# Patient Record
Sex: Male | Born: 1960
Health system: Southern US, Community
[De-identification: ages and names within clinical notes are randomized; demographics above are authoritative.]

## PROBLEM LIST (undated history)

## (undated) DIAGNOSIS — I517 Cardiomegaly: Secondary | ICD-10-CM

## (undated) DIAGNOSIS — G43909 Migraine, unspecified, not intractable, without status migrainosus: Secondary | ICD-10-CM

## (undated) DIAGNOSIS — I10 Essential (primary) hypertension: Secondary | ICD-10-CM

## (undated) DIAGNOSIS — C649 Malignant neoplasm of unspecified kidney, except renal pelvis: Secondary | ICD-10-CM

## (undated) DIAGNOSIS — C801 Malignant (primary) neoplasm, unspecified: Secondary | ICD-10-CM

## (undated) HISTORY — DX: Migraine, unspecified, not intractable, without status migrainosus: G43.909

---

## 2007-08-14 DIAGNOSIS — I10 Essential (primary) hypertension: Secondary | ICD-10-CM

## 2007-08-14 HISTORY — DX: Essential (primary) hypertension: I10

## 2009-08-13 HISTORY — PX: LAPAROSCOPIC NEPHRECTOMY: SUR781

## 2009-11-25 ENCOUNTER — Ambulatory Visit (HOSPITAL_COMMUNITY): Admission: RE | Admit: 2009-11-25 | Discharge: 2009-11-25 | Payer: Self-pay | Admitting: Family Medicine

## 2010-05-23 ENCOUNTER — Ambulatory Visit: Payer: Self-pay | Admitting: Internal Medicine

## 2010-05-25 DIAGNOSIS — R1032 Left lower quadrant pain: Secondary | ICD-10-CM | POA: Insufficient documentation

## 2010-06-05 ENCOUNTER — Ambulatory Visit (HOSPITAL_COMMUNITY): Admission: RE | Admit: 2010-06-05 | Discharge: 2010-06-05 | Payer: Self-pay | Admitting: Internal Medicine

## 2010-06-05 ENCOUNTER — Ambulatory Visit: Payer: Self-pay | Admitting: Internal Medicine

## 2010-06-06 ENCOUNTER — Ambulatory Visit (HOSPITAL_COMMUNITY): Admission: RE | Admit: 2010-06-06 | Discharge: 2010-06-06 | Payer: Self-pay | Admitting: Internal Medicine

## 2010-06-12 ENCOUNTER — Encounter: Payer: Self-pay | Admitting: Internal Medicine

## 2010-06-12 ENCOUNTER — Ambulatory Visit (HOSPITAL_COMMUNITY): Admission: RE | Admit: 2010-06-12 | Discharge: 2010-06-12 | Payer: Self-pay | Admitting: Internal Medicine

## 2010-06-13 ENCOUNTER — Telehealth (INDEPENDENT_AMBULATORY_CARE_PROVIDER_SITE_OTHER): Payer: Self-pay

## 2010-09-12 NOTE — Assessment & Plan Note (Signed)
Summary: ABD PAIN,CONSULT FOR TCS/SS   Visit Type:  Initial Consult Referring Provider:  Phillips Odor Primary Care Provider:  McGough  Chief Complaint:  abd pain.  History of Present Illness: 50 year old African American male referred here for further evaluate of left lower quadrant abdominal pain and  consideration of a colonoscopy. Patient states he's had vague left lower quadrant abdominal pain off and on for the past year;  may last for a day, may last for a week with weeks in between with no symptaoms.  No blood per rectum, no constipation or diarrhea. No upper GI tract symptoms such as reflux, nausea, vomiting, fever chills denies weight loss. Sometimes he mild discomfort with urinating. He was treated for prostatitis with Cipro and actually felt his symptoms got somewhat better around the time of antibiotic therapy. He has not had any imaging. There is no family history of colon polyps or colon cancer;  no prior colon image previously.     Current Medications (verified): 1)  Lisinopril .... One Daily 2)  Norvasc .... One Tablet Daily  Allergies (verified): No Known Drug Allergies  Past History:  Family History: Last updated: 05/23/2010 Father: Living age  62  Heart Murmurs Mother: Living age 55  healthy Siblings: One brother and one sister    healthy  Social History: Last updated: 05/23/2010 Marital Status: Widower Children: 3 Occupation: Location manager  Past Medical History: Hypertension  Past Surgical History: None  Family History: Father: Living age  61  Heart Murmurs Mother: Living age 69  healthy Siblings: One brother and one sister    healthy  Social History: Marital Status: Widower Children: 3 Occupation: Location manager  Review of Systems       no change in weight, fever, chills ,chest pain or dyspnea on exertion; otherwise as in history of present illness.  Vital Signs:  Patient profile:   50 year old male Height:      68 inches Weight:       192 pounds BMI:     29.30 Temp:     98.5 degrees F oral BP sitting:   138 / 82  (left arm) Cuff size:   regular  Vitals Entered By: Cloria Spring LPN (May 23, 2010 9:09 AM)  Physical Exam  General:  alert conversant in no acute distress Eyes:  no scleral icterus. Lungs:  clear to auscultation Heart:  regular rate rhythm without murmur gallop or Abdomen:  flat positive bowel sounds soft and entirely nontender to deep palpation. No appreciable mass or hepatosplenomegaly Rectal:  deferred to time of colonoscopy.  Impression & Recommendations: Impression: Very pleasant 50 year old gentleman with intermittent self-limiting bouts of left lower quadrant abdominal pain. Symptoms are somewhat vague and has not affected his regular routine. No associated bowel symptoms. There's a questioable association with urinary tract symptoms and some transient improvemen, temporally, with  antibiotic therapy.  Although he could be having a low-grade recurrent diverticulitis the presentation would be very atypical. Abdominal wall etiology or even occult urinary tract pathology such as kidney stones would remain in the differential at this time.  I do agree with Dr. Phillips Odor that he needs a screening colonoscopy.  Recommendations: Screening/diagnostic colonoscopy in the near future risks, benefits, limitations imponderables and alternatives have been reviewed;  questions have been answered. He's agreeable.  He may end up getting a CT following colonoscopy depending on what we find. Further recommendations to follow.  'd like to thank Dr. Dorthey Sawyer for his kind referral of this nice gentleman.  Appended Document: Orders Update    Clinical Lists Changes  Problems: Added new problem of ABDOMINAL PAIN, LEFT LOWER QUADRANT (ICD-789.04) Orders: Added new Service order of Consultation Level III [id number]) - Signed

## 2010-09-12 NOTE — Progress Notes (Signed)
Summary: phone note/ MRI results  Phone Note Other Incoming   Caller: Jane from [facility] Summary of Call: Erskine Squibb from [facility] called STAT report on MRI. Pt has a 1.1 CM medial upper pole left renal lesion, suspicious for cystic renal cell carcinomer. Urology consult is recommended. Initial call taken by: Cloria Spring LPN,  June 13, 2010 10:42 AM     Appended Document: phone note/ MRI results See Dr. Luvenia Starch comment under the MRI report.

## 2010-09-12 NOTE — Letter (Signed)
Summary: TCS ORDER  TCS ORDER   Imported By: Ave Filter 05/23/2010 10:20:45  _____________________________________________________________________  External Attachment:    Type:   Image     Comment:   External Document

## 2010-09-12 NOTE — Letter (Signed)
Summary: MRI ABD ORDER  MRI ABD ORDER   Imported By: Ave Filter 06/12/2010 16:09:09  _____________________________________________________________________  External Attachment:    Type:   Image     Comment:   External Document  Appended Document: MRI ABD ORDER VQQ#[id number]

## 2010-09-12 NOTE — Miscellaneous (Signed)
Summary: Orders Update  Clinical Lists Changes  Problems: Added new problem of SCREENING FOR UNSPECIFIED CONDITION (ICD-V82.9) Orders: Added new Test order of T-Creatinine Blood ([id number]) - Signed 

## 2010-10-25 LAB — CREATININE, SERUM
Creatinine, Ser: 1.1 mg/dL (ref 0.4–1.5)
GFR calc non Af Amer: >60 mL/min (ref >60)

## 2012-02-20 ENCOUNTER — Emergency Department (HOSPITAL_COMMUNITY): Payer: Managed Care, Other (non HMO)

## 2012-02-20 ENCOUNTER — Observation Stay (HOSPITAL_COMMUNITY)
Admission: EM | Admit: 2012-02-20 | Discharge: 2012-02-21 | Disposition: A | Discharge status: Home / Self Care | Payer: Managed Care, Other (non HMO) | Source: Ambulatory Visit | Attending: Emergency Medicine | Admitting: Emergency Medicine

## 2012-02-20 ENCOUNTER — Encounter (HOSPITAL_COMMUNITY): Payer: Self-pay

## 2012-02-20 DIAGNOSIS — R0989 Other specified symptoms and signs involving the circulatory and respiratory systems: Secondary | ICD-10-CM | POA: Insufficient documentation

## 2012-02-20 DIAGNOSIS — R079 Chest pain, unspecified: Principal | ICD-10-CM | POA: Insufficient documentation

## 2012-02-20 DIAGNOSIS — I517 Cardiomegaly: Secondary | ICD-10-CM | POA: Insufficient documentation

## 2012-02-20 DIAGNOSIS — C801 Malignant (primary) neoplasm, unspecified: Secondary | ICD-10-CM | POA: Insufficient documentation

## 2012-02-20 DIAGNOSIS — C649 Malignant neoplasm of unspecified kidney, except renal pelvis: Secondary | ICD-10-CM | POA: Insufficient documentation

## 2012-02-20 DIAGNOSIS — R42 Dizziness and giddiness: Secondary | ICD-10-CM | POA: Insufficient documentation

## 2012-02-20 DIAGNOSIS — R0609 Other forms of dyspnea: Secondary | ICD-10-CM | POA: Insufficient documentation

## 2012-02-20 DIAGNOSIS — I10 Essential (primary) hypertension: Secondary | ICD-10-CM | POA: Insufficient documentation

## 2012-02-20 HISTORY — DX: Malignant neoplasm of unspecified kidney, except renal pelvis: C64.9

## 2012-02-20 HISTORY — DX: Malignant (primary) neoplasm, unspecified: C80.1

## 2012-02-20 HISTORY — DX: Essential (primary) hypertension: I10

## 2012-02-20 HISTORY — DX: Cardiomegaly: I51.7

## 2012-02-20 LAB — COMPREHENSIVE METABOLIC PANEL
AST: 26 U/L (ref 0–37)
Albumin: 3.8 g/dL (ref 3.5–5.2)
BUN: 13 mg/dL (ref 6–23)
Calcium: 9.4 mg/dL (ref 8.4–10.5)
Chloride: 104 mEq/L (ref 96–112)
Creatinine, Ser: 1.07 mg/dL (ref 0.50–1.35)
Total Bilirubin: 0.3 mg/dL (ref 0.3–1.2)
Total Protein: 7.9 g/dL (ref 6.0–8.3)

## 2012-02-20 LAB — POCT I-STAT TROPONIN I
Troponin i, poc: 0 ng/mL (ref 0.00–0.08)
Troponin i, poc: 0 ng/mL (ref 0.00–0.08)

## 2012-02-20 LAB — CBC
HCT: 39.2 % (ref 39.0–52.0)
RDW: 14.5 % (ref 11.5–15.5)
WBC: 5.2 10*3/uL (ref 4.0–10.5)

## 2012-02-20 LAB — CK TOTAL AND CKMB (NOT AT ARMC)
CK, MB: 6.1 ng/mL (ref 0.3–4.0)
Relative Index: 0.9 (ref 0.0–2.5)
Total CK: 701 U/L — ABNORMAL HIGH (ref 7–232)

## 2012-02-20 LAB — TROPONIN I: Troponin I: <0.3 ng/mL (ref <0.30)

## 2012-02-20 LAB — PROTIME-INR
INR: 1.06 (ref 0.00–1.49)
Prothrombin Time: 14 seconds (ref 11.6–15.2)

## 2012-02-20 LAB — APTT: aPTT: 29 seconds (ref 24–37)

## 2012-02-20 MED ORDER — SODIUM CHLORIDE 0.9 % IV SOLN
1000.0000 mL | INTRAVENOUS | Status: DC
  Administered 2012-02-20: 1000 mL via INTRAVENOUS

## 2012-02-20 MED ORDER — ASPIRIN 81 MG PO CHEW
162.0000 mg | CHEWABLE_TABLET | Freq: Once | ORAL | Status: AC
  Administered 2012-02-20: 162 mg via ORAL
  Filled 2012-02-20: qty 2

## 2012-02-20 NOTE — ED Provider Notes (Signed)
Patient moved to CDU under chest pain protocol. Patient resting comfortably at present without return of chest pain since 3 pm. Lungs CTA bilaterally. S1/S2, RRR, no murmur. Abdomen soft, bowel sounds present. Strong distal pulses palpated all extremities. Sinus rhythm on monitor without ectopy. Troponin negative x 2, 12 lead reviewed, no indication of ischemia. Patient scheduled for stress test in AM. Diagnostic and treatment plan discussed with patient.  Patient care to be resumed by Dr. Anitra Lauth. Patient history has been discussed with physician resuming care. Patient is in the CDU for observation and has stress tests pending. It has been discussed w oncoming provider that even though pt has a BMI with in limits for CT chest it was though by previous provider that d/t the fact that the pt has a history of nephrectomy they should not have the contrast dye. Disposition will be determined once results obtained. Patient is currently asymptomatic, in NAD, VSS, and has no current complaints.     Jaci Carrel, [place] 02/22/12 [phone]

## 2012-02-20 NOTE — ED Notes (Signed)
Pt complains of chest pain ,diaphoresis, nausea and headache on set this am, and right arm pian.

## 2012-02-20 NOTE — ED Notes (Signed)
BMI = 31.1

## 2012-02-20 NOTE — ED Provider Notes (Addendum)
History     CSN: [id number]  Arrival date & time 02/20/12  1235   First MD Initiated Contact with Patient 02/20/12 1742      Chief Complaint  Patient presents with  . Chest Pain    (Consider location/radiation/quality/duration/timing/severity/associated sxs/prior treatment) Patient is a 51 y.o. male presenting with chest pain. The history is provided by the patient and medical records.  Chest Pain Duration of episode(s) is 5 hours. Chest pain occurs intermittently. The chest pain is resolved. The pain is associated with breathing and exertion. At its most intense, the pain is at 6/10. The pain is currently at 0/10. The quality of the pain is described as pleuritic, heavy and tightness. The pain radiates to the right shoulder. Chest pain is worsened by deep breathing and exertion. Primary symptoms include dizziness. Pertinent negatives for primary symptoms include no fatigue, no syncope, no shortness of breath, no wheezing, no abdominal pain, no nausea and no vomiting.  Dizziness also occurs with diaphoresis. Dizziness does not occur with nausea, vomiting or weakness.   Associated symptoms include diaphoresis.  Pertinent negatives for associated symptoms include no near-syncope, no orthopnea, no paroxysmal nocturnal dyspnea and no weakness. He tried nothing for the symptoms. Risk factors include sedentary lifestyle.  His past medical history is significant for hypertension.  Pertinent negatives for past medical history include no CAD, no congenital heart disease, no COPD, no CHF, no diabetes, no DVT and no PE.  Pertinent negatives for family medical history include: no CAD in family and no early MI in family.  Procedure history is negative for cardiac catheterization, echocardiogram and stress echo.     Past Medical History  Diagnosis Date  . Hypertension   . Enlarged heart   . Cancer   . Kidney cancer, primary, with metastasis from kidney to other site     History reviewed. No  pertinent past surgical history.  History reviewed. No pertinent family history.  History  Substance Use Topics  . Smoking status: Former Games developer  . Smokeless tobacco: Not on file  . Alcohol Use: Yes      Review of Systems  Constitutional: Positive for diaphoresis. Negative for fatigue.  HENT: Negative for neck pain.   Respiratory: Positive for chest tightness. Negative for apnea, shortness of breath and wheezing.   Cardiovascular: Positive for chest pain. Negative for orthopnea, syncope and near-syncope.  Gastrointestinal: Negative for nausea, vomiting, abdominal pain and abdominal distention.  Genitourinary: Negative for dysuria and difficulty urinating.  Skin: Negative for color change.  Neurological: Positive for dizziness and light-headedness. Negative for syncope and weakness.  Hematological: Does not bruise/bleed easily.  Psychiatric/Behavioral: Negative for agitation.    Allergies  Review of patient's allergies indicates no known allergies.  Home Medications   Current Outpatient Rx  Name Route Sig Dispense Refill  . AMLODIPINE BESYLATE 10 MG PO TABS Oral Take 10 mg by mouth every morning.    Marland Kitchen OMEGA-3 FATTY ACIDS 1000 MG PO CAPS Oral Take 1 g by mouth daily.    Marland Kitchen LISINOPRIL 10 MG PO TABS Oral Take 10 mg by mouth every morning.    . ADULT MULTIVITAMIN W/MINERALS CH Oral Take 1 tablet by mouth daily.    Marland Kitchen VITAMIN C 500 MG PO TABS Oral Take 500 mg by mouth daily.      BP 124/79  Pulse 91  Temp 98 F (36.7 C) (Oral)  Resp 22  SpO2 100%  Physical Exam  Constitutional: He is oriented to person, place, and time.  He appears well-developed.  HENT:  Head: Normocephalic.  Eyes: Conjunctivae and EOM are normal. Pupils are equal, round, and reactive to light.  Neck: Normal range of motion. Neck supple.  Cardiovascular: Normal rate, regular rhythm and normal heart sounds.   No murmur heard. Pulmonary/Chest: Effort normal and breath sounds normal. No respiratory  distress. He has no wheezes.  Abdominal: Soft. Bowel sounds are normal. He exhibits no distension. There is no tenderness.  Musculoskeletal: Normal range of motion.  Neurological: He is alert and oriented to person, place, and time.  Skin: Skin is warm and dry.    ED Course  Procedures (including critical care time)  Labs Reviewed  CBC - Abnormal; Notable for the following:    MCV 77.5 (*)     All other components within normal limits  CK TOTAL AND CKMB - Abnormal; Notable for the following:    Total CK 701 (*)     CK, MB 6.1 (*)     All other components within normal limits  COMPREHENSIVE METABOLIC PANEL - Abnormal; Notable for the following:    GFR calc non Af Amer 80 (*)     All other components within normal limits  POCT I-STAT TROPONIN I  PROTIME-INR  APTT  TROPONIN I  D-DIMER, QUANTITATIVE   Dg Chest 2 View  02/20/2012  *RADIOLOGY REPORT*  Clinical Data: Chest pain.  Hypertension.  CHEST - 2 VIEW  Comparison: 11/25/2009  Findings: Heart size is normal.  Mediastinal shadows are normal. Lungs are clear.  There is chronic relative elevation of the left hemidiaphragm.  Vascularity is normal.  No effusions.  No significant bony findings.  IMPRESSION: No active disease  Original Report Authenticated By: Thomasenia Sales, M.D.     No diagnosis found.    MDM   DDX: ACS syndrome, PE, costochondritis, pneumonia, GERD  A/P: 51 y/o male with hx of HTN, renal cancer s/p nephrectomy from 3 years ago, and previous smoker comes in with cc of chest pain. The chest pain has some typical and atypical features. heaviness, tightness, pressure like, exertional, pleuritic, right sided).  Concerns are for ACS syndrome and PE. Pt's Wells score for PE is 0 (1 if we consider previous malignancy). His vitals are stable and WNL, no hypoxia. We will get d-dimer.  Pt's TIMI score is 0, but as the chest pain is typical, if Pe workup is negative, patient will be appropriate for obs  admission.  8:54 PM Pt's d-dimer negative, and troponin neg. EKG with no acute findings. Pt would prefer outpatient workup, and i called patient's PCP Dr. Renette Butters, at the office number and at [phone] (cell) and have been unsuccessful at reaching him directly. Plan is to admit to obs at this time as i am unable to reach the pcp. Pt aware.  8:56 PM  Date: 02/20/2012  Rate: 87  Rhythm: normal sinus rhythm  QRS Axis: normal  Intervals: normal  ST/T Wave abnormalities: normal  Conduction Disutrbances:none  Narrative Interpretation:   Old EKG Reviewed: none available    Derwood Kaplan, MD 02/20/12 2129  11:34 AM Pt s/p exercise stress, that is reported negative. Family is eager to go home, and not wanting to stay for a formal discharge. Will discuss the results with family and discharge.  Derwood Kaplan, MD 02/21/12 1134

## 2012-02-20 NOTE — ED Notes (Signed)
Critical ckmb 6.1 reported.

## 2012-02-20 NOTE — ED Notes (Signed)
Pt states pain was radiating down right arm but has resolved and HA has resolved.

## 2012-02-21 DIAGNOSIS — R072 Precordial pain: Secondary | ICD-10-CM

## 2012-02-21 NOTE — ED Notes (Signed)
Patient asking when he will be receiving his results. Called and spoke with rich. . He states he has just sent pt images to dr Tenny Craw. He will call dr Tenny Craw and ask for reading to be expidited. Patients male visitor gives impression she is angry. We have offered her food and drink but she is short and sharp with staff. Stating she just wants to leave. Pt states he is leaving in the next 30 minutes. Sinus rhythm on the montor. Pt denies pain . States he knows that there is nothing wrong with him.

## 2012-02-21 NOTE — Progress Notes (Signed)
  Echocardiogram Echocardiogram Stress Test has been performed.  [place]ian Co 02/21/2012, 8:55 AM

## 2012-02-21 NOTE — ED Notes (Signed)
Called rich again due to no results. Luan Pulling is going to call dr again.

## 2012-02-21 NOTE — ED Notes (Signed)
Dr Tenny Craw has called stress results. Pt stress test wnl. Dr Rhunette Croft aware and will discharge pt. Pt given meal

## 2012-02-21 NOTE — ED Notes (Signed)
Family at bedside. 

## 2012-02-25 NOTE — ED Provider Notes (Signed)
Medical screening examination/treatment/procedure(s) were performed by non-physician practitioner and as supervising physician I was immediately available for consultation/collaboration.   Dione Booze, MD 02/25/12 [phone]

## 2012-11-05 ENCOUNTER — Ambulatory Visit (HOSPITAL_COMMUNITY)
Admission: RE | Admit: 2012-11-05 | Discharge: 2012-11-05 | Disposition: A | Discharge status: Home / Self Care | Payer: Managed Care, Other (non HMO) | Source: Ambulatory Visit | Attending: Physician Assistant | Admitting: Physician Assistant

## 2012-11-05 ENCOUNTER — Other Ambulatory Visit (HOSPITAL_COMMUNITY): Payer: Self-pay | Admitting: Physician Assistant

## 2012-11-05 ENCOUNTER — Other Ambulatory Visit (HOSPITAL_COMMUNITY): Payer: Managed Care, Other (non HMO)

## 2012-11-05 ENCOUNTER — Encounter (HOSPITAL_COMMUNITY): Payer: Self-pay

## 2012-11-05 DIAGNOSIS — R1032 Left lower quadrant pain: Secondary | ICD-10-CM

## 2012-11-05 DIAGNOSIS — I10 Essential (primary) hypertension: Secondary | ICD-10-CM | POA: Insufficient documentation

## 2012-11-05 DIAGNOSIS — Z85528 Personal history of other malignant neoplasm of kidney: Secondary | ICD-10-CM | POA: Insufficient documentation

## 2012-11-05 DIAGNOSIS — Q619 Cystic kidney disease, unspecified: Secondary | ICD-10-CM | POA: Insufficient documentation

## 2012-11-05 MED ORDER — IOHEXOL 300 MG/ML  SOLN
100.0000 mL | Freq: Once | INTRAMUSCULAR | Status: AC | PRN
  Administered 2012-11-05: 100 mL via INTRAVENOUS

## 2013-01-15 ENCOUNTER — Ambulatory Visit (INDEPENDENT_AMBULATORY_CARE_PROVIDER_SITE_OTHER): Payer: Self-pay | Admitting: Surgery

## 2013-04-07 ENCOUNTER — Telehealth (INDEPENDENT_AMBULATORY_CARE_PROVIDER_SITE_OTHER): Payer: Self-pay

## 2013-04-07 NOTE — Telephone Encounter (Signed)
LMOM asking pt if he would like to come in tomorrow rather than Friday.

## 2013-04-10 ENCOUNTER — Ambulatory Visit (INDEPENDENT_AMBULATORY_CARE_PROVIDER_SITE_OTHER): Payer: Managed Care, Other (non HMO) | Admitting: Surgery

## 2013-05-22 IMAGING — CT CT ABD-PELV W/ CM
2 of 5 series · 15 of 46 positions shown, 17 images · IV contrast (Omnipaque 300)
Comparison: CT abdomen pelvis - 06/06/2010; CT abdomen pelvis -
06/12/2010

CLINICAL DATA: Left lower quadrant abdominal pain, history of left-
sided renal carcinoma, post partial nephrectomy

CT ABDOMEN AND PELVIS WITH CONTRAST
TECHNIQUE: Multidetector CT imaging of the abdomen and pelvis was
performed following the standard protocol during bolus
administration of intravenous contrast.
Contrast: 100mL OMNIPAQUE IOHEXOL 300 MG/ML  SOLN

[Series 2: abd_pel_with 5.0 b40f · axial · 0.69mm/px · z∈[-427,-62]mm · 12 of 83 slices shown, 14 images]
[im 5/83  soft-tissue]
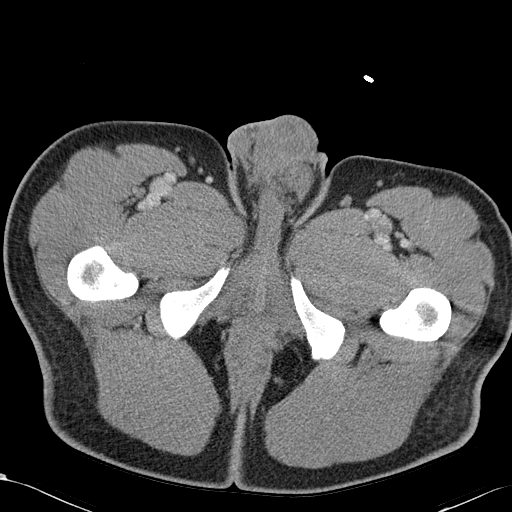
[im 5/83  bone]
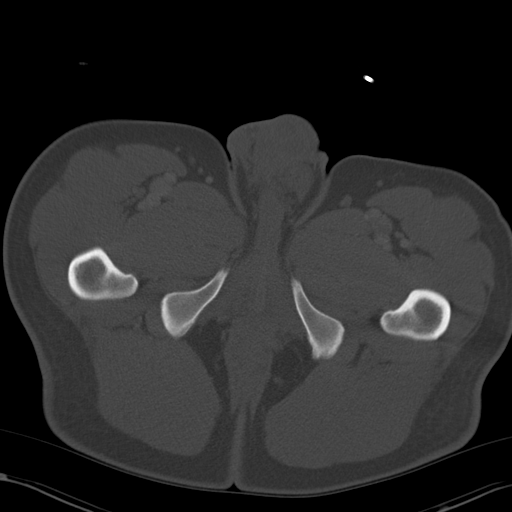
[im 13/83  soft-tissue]
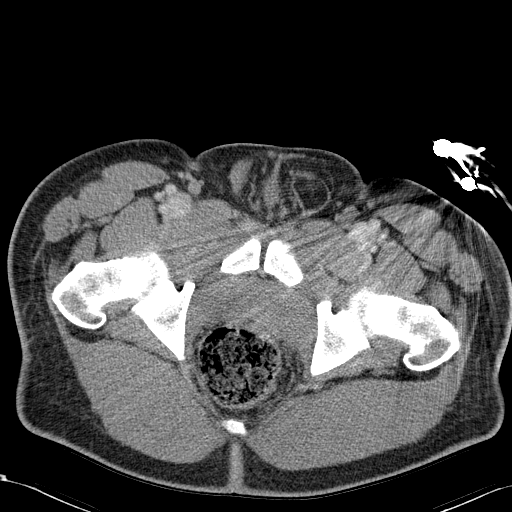
[im 18/83  soft-tissue]
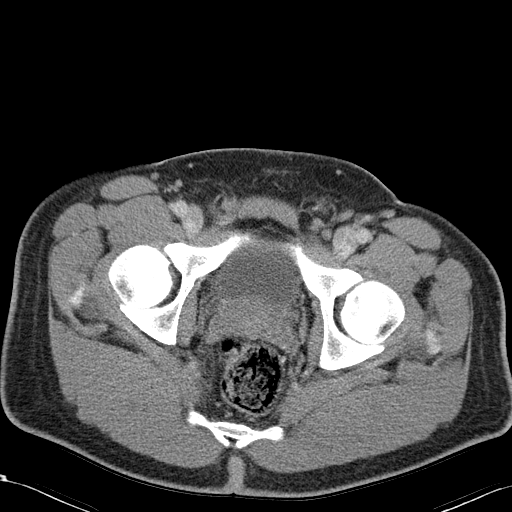
[im 26/83  soft-tissue]
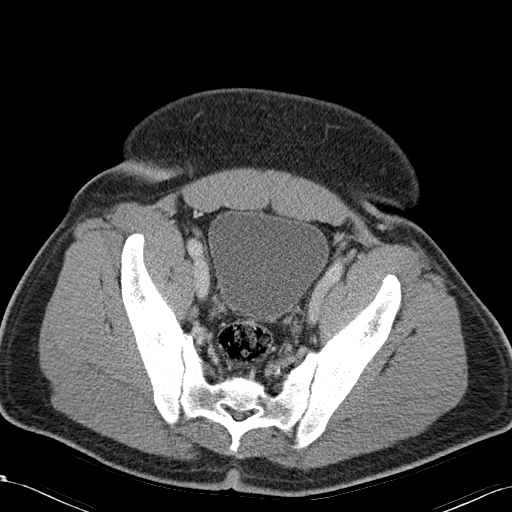
[im 31/83  soft-tissue]
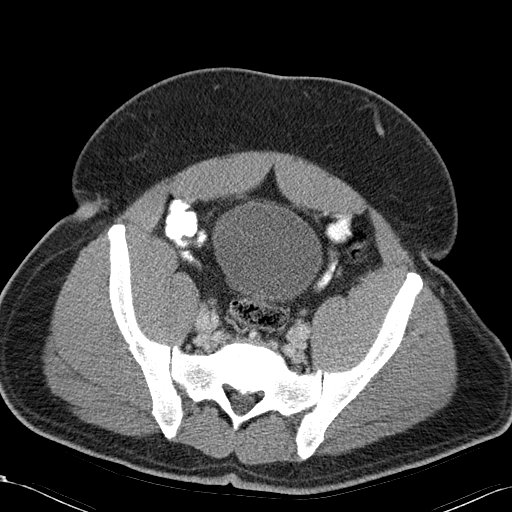
[im 39/83  soft-tissue]
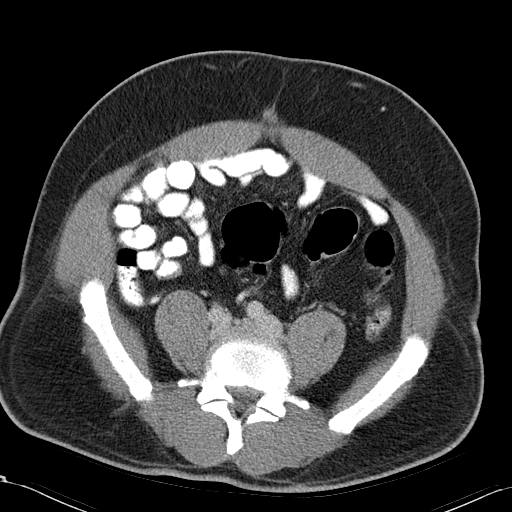
[im 44/83  soft-tissue]
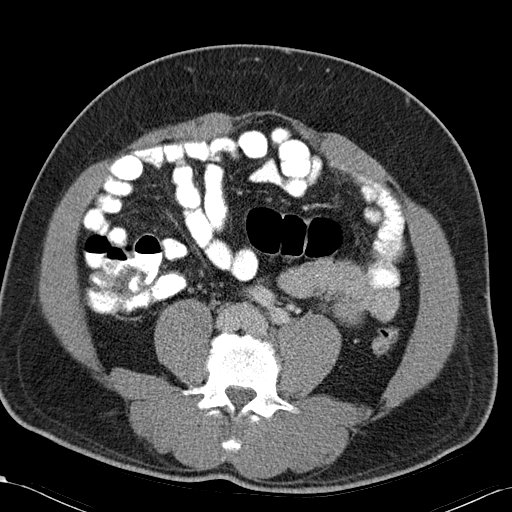
[im 52/83  soft-tissue]
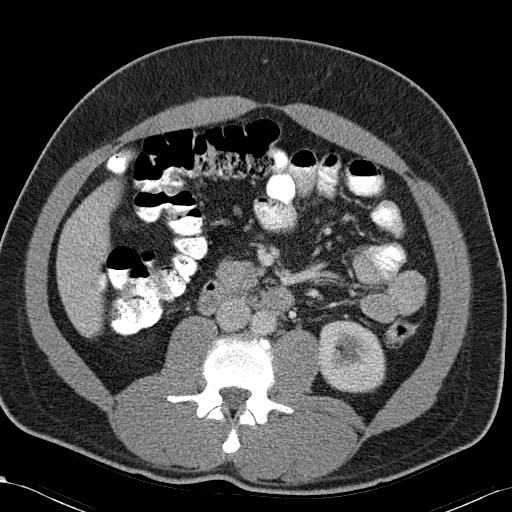
[im 57/83  soft-tissue]
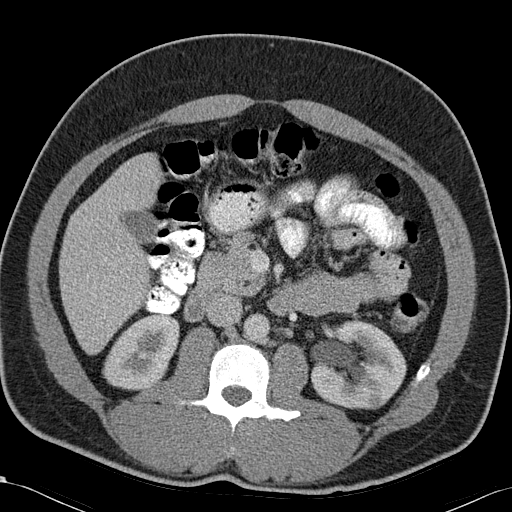
[im 57/83  bone]
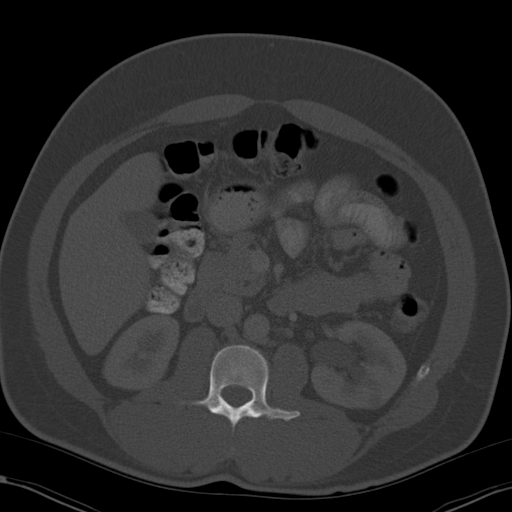
[im 65/83  soft-tissue]
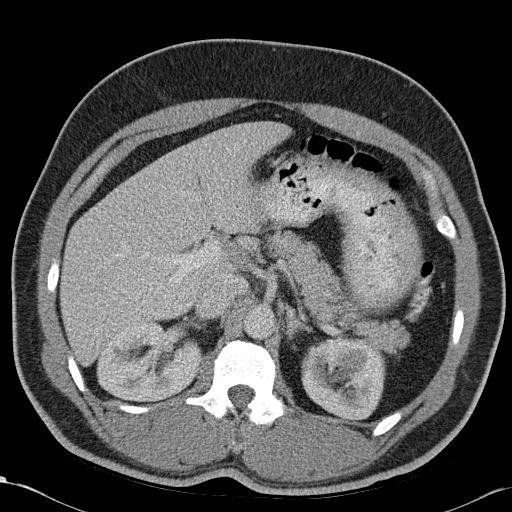
[im 70/83  soft-tissue]
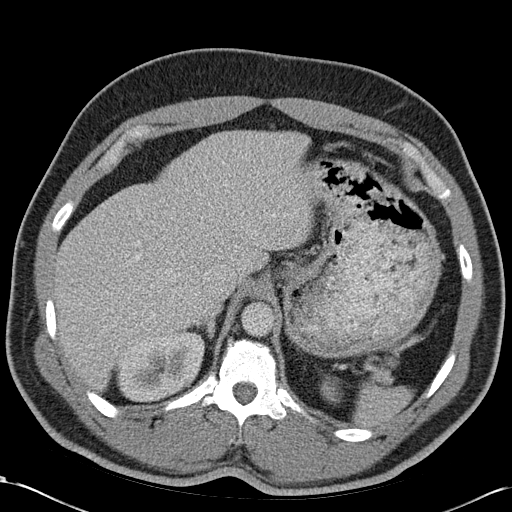
[im 78/83  soft-tissue]
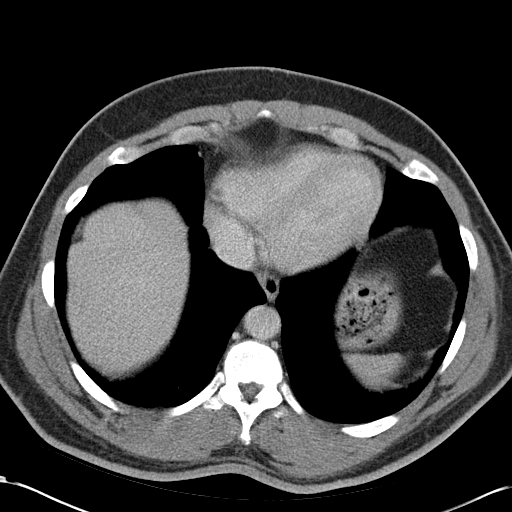

[Series 4: abd_pel_with 3.0 spo · coronal · 0.67mm/px · 3 of 103 slices shown]
[im 35/103  soft-tissue]
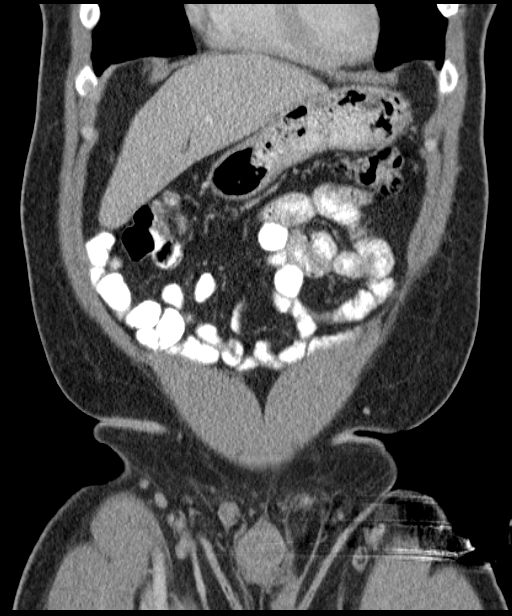
[im 46/103  soft-tissue]
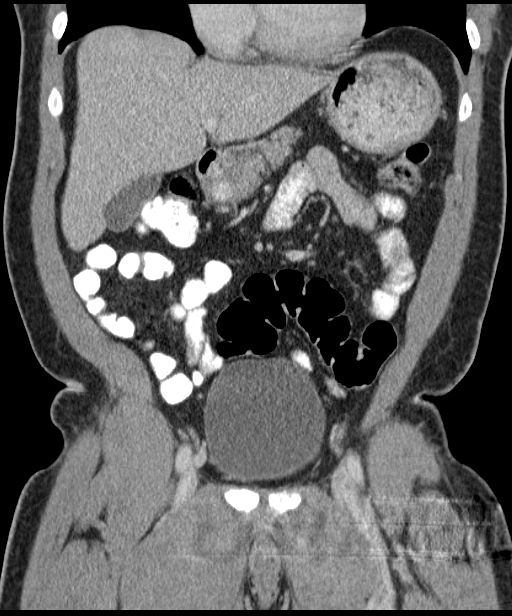
[im 57/103  soft-tissue]
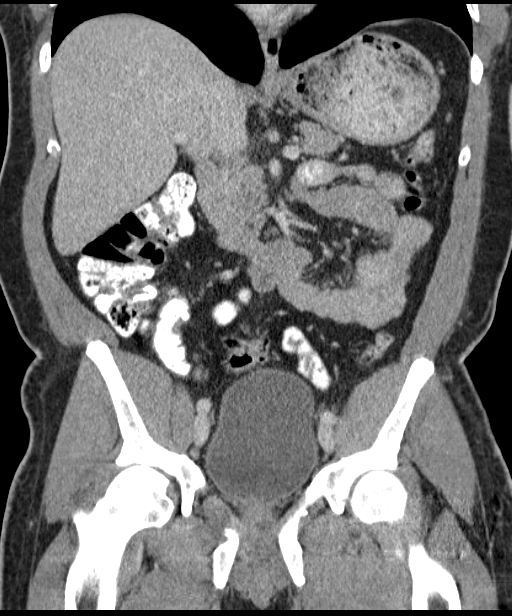

[15 of 46 positions shown; findings below may reference images not displayed]

FINDINGS: Normal hepatic contour.  No discrete hepatic lesions.  Normal
appearance of the gallbladder.  No intra or extrahepatic biliary
ductal dilatation.  No ascites.

There is symmetric enhancement and excretion of the bilateral
kidneys.  There is a postsurgical defect involving the superior
pole of the left kidney compatible with provided history of partial
nephrectomy. No new renal lesions.  Grossly unchanged appearance of
the adjacent approximately 1.3 and 1.4 cm hypoattenuating renal
cyst within the mid aspect of the left kidney (coronal images 84
and 83, series 4, respectively).  There is an unchanged
approximately 1.9 x 1.5 cm hypoattenuating lesion within the
anterior superior/mid aspect of the left kidney (image 15, series
7) which cannot be characterized as simple renal cyst though is
grossly unchanged f and previously characterized as a complex renal
cyst on abdominal MRI performed 06/12/2010.  Additional left-sided
renal cyst or too small to accurately characterize.  No discrete
right-sided renal lesions.  No definite renal stones on this
postcontrast examination.  No urinary obstruction or perinephric
stranding.  Normal appearance of the bilateral adrenal glands,
pancreas and spleen.

Ingested enteric contrast extends to the level of the hepatic
flexure of the colon.  Moderate colonic stool burden without
evidence of obstruction.  The bowel is otherwise normal in course
and caliber without wall thickening.  Normal appearance of the
appendix.  No pneumoperitoneum, pneumatosis or portal venous gas.

There is scattered very minimal atherosclerotic plaque within the
distal aspect of a normal caliber abdominal aorta.  No
retroperitoneal, mesenteric, pelvic or inguinal lymphadenopathy.
The prostate is borderline enlarged.  No free fluid in the pelvis.

Limited visualization of the lower thorax demonstrates minimal
linear subsegmental atelectasis within the right lower lobe.  No
focal airspace opacities.  No pleural effusion or pneumothorax.
Normal heart size.  No pericardial effusion.

No acute or aggressive osseous abnormalities.

Unchanged appearance of small left-sided indirect mesenteric fat
containing inguinal hernia.
IMPRESSION: 1.  No definite explanation for patient's left lower quadrant
abdominal pain.  Specifically, no evidence of enteric or urinary
obstruction.
2.  Unchanged appearance of small left-sided indirect mesenteric
fat containing inguinal hernia.
3.  Post partial nephrectomy of the superior pole of the left
kidney with resection of previously identified left upper pole
cystic renal cell carcinoma.] Additional left-sided simple and
complex left-sided renal cysts are grossly unchanged compared to
the [DATE] examinations.

## 2013-09-03 ENCOUNTER — Ambulatory Visit (INDEPENDENT_AMBULATORY_CARE_PROVIDER_SITE_OTHER): Payer: Managed Care, Other (non HMO) | Admitting: Family Medicine

## 2013-09-03 ENCOUNTER — Encounter: Payer: Self-pay | Admitting: Family Medicine

## 2013-09-03 ENCOUNTER — Encounter (INDEPENDENT_AMBULATORY_CARE_PROVIDER_SITE_OTHER): Payer: Self-pay

## 2013-09-03 VITALS — BP 140/96 | HR 90 | Resp 18 | Ht 68.0 in | Wt 214.1 lb

## 2013-09-03 DIAGNOSIS — E669 Obesity, unspecified: Secondary | ICD-10-CM

## 2013-09-03 DIAGNOSIS — J329 Chronic sinusitis, unspecified: Secondary | ICD-10-CM | POA: Insufficient documentation

## 2013-09-03 DIAGNOSIS — I10 Essential (primary) hypertension: Secondary | ICD-10-CM | POA: Insufficient documentation

## 2013-09-03 MED ORDER — LISINOPRIL-HYDROCHLOROTHIAZIDE 20-12.5 MG PO TABS: 1.0000 | ORAL_TABLET | Freq: Every day | ORAL | Status: DC

## 2013-09-03 MED ORDER — LEVOFLOXACIN 500 MG PO TABS: 500.0000 mg | ORAL_TABLET | Freq: Every day | ORAL | Status: DC

## 2013-09-03 MED ORDER — BENZONATATE 100 MG PO CAPS: 100.0000 mg | ORAL_CAPSULE | Freq: Four times a day (QID) | ORAL | Status: DC | PRN

## 2013-09-03 NOTE — Patient Instructions (Addendum)
F/u in 2 month with EKG, call if you need me before  Blood pressure is high, change is being made in one of your medications from zestrril to zestoretic, continue amlodipine as before  Levaquin and a decongestant are prescribed for your sinuses  It is important that you exercise regularly at least 30 minutes 5 times a week. If you develop chest pain, have severe difficulty breathing, or feel very tired, stop exercising immediately and seek medical attention   Weight loss goal of 2 to 3 pounds per month  Fasting CBC, lipid, cmp , HBA1C and TSH as soon as possibble   . DASH Diet The DASH diet stands for "Dietary Approaches to Stop Hypertension." It is a healthy eating plan that has been shown to reduce high blood pressure (hypertension) in as little as 14 days, while also possibly providing other significant health benefits. These other health benefits include reducing the risk of breast cancer after menopause and reducing the risk of type 2 diabetes, heart disease, colon cancer, and stroke. Health benefits also include weight loss and slowing kidney failure in patients with chronic kidney disease.  DIET GUIDELINES  Limit salt (sodium). Your diet should contain less than 1500 mg of sodium daily.  Limit refined or processed carbohydrates. Your diet should include mostly whole grains. Desserts and added sugars should be used sparingly.  Include small amounts of heart-healthy fats. These types of fats include nuts, oils, and tub margarine. Limit saturated and trans fats. These fats have been shown to be harmful in the body. CHOOSING FOODS  The following food groups are based on a 2000 calorie diet. See your Registered Dietitian for individual calorie needs. Grains and Grain Products (6 to 8 servings daily)  Eat More Often: Whole-wheat bread, brown rice, whole-grain or wheat pasta, quinoa, popcorn without added fat or salt (air popped).  Eat Less Often: White bread, white pasta, white rice,  cornbread. Vegetables (4 to 5 servings daily)  Eat More Often: Fresh, frozen, and canned vegetables. Vegetables may be raw, steamed, roasted, or grilled with a minimal amount of fat.  Eat Less Often/Avoid: Creamed or fried vegetables. Vegetables in a cheese sauce. Fruit (4 to 5 servings daily)  Eat More Often: All fresh, canned (in natural juice), or frozen fruits. Dried fruits without added sugar. One hundred percent fruit juice ( cup [237 mL] daily).  Eat Less Often: Dried fruits with added sugar. Canned fruit in light or heavy syrup. [employer], Fish, and Poultry (2 servings or less daily. One serving is 3 to 4 oz [85-114 g]).  Eat More Often: Ninety percent or leaner ground beef, tenderloin, sirloin. Round cuts of beef, chicken breast, [place] breast. All fish. Grill, bake, or broil your meat. Nothing should be fried.  Eat Less Often/Avoid: Fatty cuts of meat, [place], or chicken leg, thigh, or wing. Fried cuts of meat or fish. Dairy (2 to 3 servings)  Eat More Often: Low-fat or fat-free milk, low-fat plain or light yogurt, reduced-fat or part-skim cheese.  Eat Less Often/Avoid: Milk (whole, 2%).Whole milk yogurt. Full-fat cheeses. Nuts, Seeds, and Legumes (4 to 5 servings per week)  Eat More Often: All without added salt.  Eat Less Often/Avoid: Salted nuts and seeds, canned beans with added salt. Fats and Sweets (limited)  Eat More Often: Vegetable oils, tub margarines without trans fats, sugar-free gelatin. Mayonnaise and salad dressings.  Eat Less Often/Avoid: Coconut oils, palm oils, butter, stick margarine, cream, half and half, cookies, candy, pie. FOR MORE INFORMATION The  Dash Diet Eating Plan: www.dashdiet.org Document Released: 07/19/2011 Document Revised: 10/22/2011 Document Reviewed: 07/19/2011 [facility] Patient Information 2014 [place], [place].

## 2013-09-04 LAB — LIPID PANEL
CHOLESTEROL: 177 mg/dL (ref 0–200)
HDL: 40 mg/dL (ref >39)
LDL Cholesterol: 118 mg/dL — ABNORMAL HIGH (ref 0–99)
Total CHOL/HDL Ratio: 4.4 Ratio
Triglycerides: 96 mg/dL (ref <150)
VLDL: 19 mg/dL (ref 0–40)

## 2013-09-04 LAB — CBC
HCT: 40.4 % (ref 39.0–52.0)
HEMOGLOBIN: 13.5 g/dL (ref 13.0–17.0)
MCH: 25.6 pg — AB (ref 26.0–34.0)
MCHC: 33.4 g/dL (ref 30.0–36.0)
MCV: 76.7 fL — AB (ref 78.0–100.0)
Platelets: 258 10*3/uL (ref 150–400)
RBC: 5.27 MIL/uL (ref 4.22–5.81)
RDW: 15.5 % (ref 11.5–15.5)
WBC: 4.2 10*3/uL (ref 4.0–10.5)

## 2013-09-04 LAB — COMPREHENSIVE METABOLIC PANEL
ALBUMIN: 4.1 g/dL (ref 3.5–5.2)
ALT: 32 U/L (ref 0–53)
AST: 19 U/L (ref 0–37)
Alkaline Phosphatase: 59 U/L (ref 39–117)
BUN: 12 mg/dL (ref 6–23)
CALCIUM: 9.7 mg/dL (ref 8.4–10.5)
CHLORIDE: 104 meq/L (ref 96–112)
CO2: 28 meq/L (ref 19–32)
Creat: 1.12 mg/dL (ref 0.50–1.35)
GLUCOSE: 101 mg/dL — AB (ref 70–99)
POTASSIUM: 4.4 meq/L (ref 3.5–5.3)
Sodium: 136 mEq/L (ref 135–145)
Total Bilirubin: 0.4 mg/dL (ref 0.3–1.2)
Total Protein: 7.7 g/dL (ref 6.0–8.3)

## 2013-09-04 LAB — HEMOGLOBIN A1C
Hgb A1c MFr Bld: 6.1 % — ABNORMAL HIGH (ref <5.7)
Mean Plasma Glucose: 128 mg/dL — ABNORMAL HIGH (ref <117)

## 2013-09-04 LAB — TSH: TSH: 0.998 u[IU]/mL (ref 0.350–4.500)

## 2013-09-04 NOTE — Assessment & Plan Note (Signed)
DPatient educated about  the importance of commitment to a  minimum of 150 minutes of exercise per week. The importance of healthy food choices with portion control discussed. Encouraged to start a food diary, count calories and to consider  joining a support group. Sample diet sheets offered. Goals set by the patient for the next several months.

## 2013-09-04 NOTE — Assessment & Plan Note (Signed)
Antibiotic, decongestant and nasal saline flushes till treatment completed

## 2013-09-04 NOTE — Progress Notes (Signed)
   Subjective:    Patient ID: Bobby Delacruz, male    DOB: [birth year], 53 y.o.   MRN: [id number]  HPI  Pt in to establish care. History reviewed, some confusion as to exact status of kidney cancer history , medical record will need to be reviewed  .Generally describes good health and only currently being treated for HTN He is concerned about weight and the fact that he is no longer exercising as he had in the past. None of his immediate family are diabetic and as far as he knows , his lipid profile has been good . 2 week h/o sinus pressure with headache and yellow green nasal drainage, denies sore throat or ear pain or chest congestion, had fever and chills up to 1 week ago Notes increased stress in the past 1 year, still adjusting to new married life, works long hours and is also very involved in [employer] activities. States 1 year before he was diagnosed , he lost his 77 y/o wife to gall bladder cancer, currently i 3rd marriage Review of Systems See HPI  Denies chest pains, palpitations and leg swelling. No f/h of premature CAD Denies abdominal pain, nausea, vomiting,diarrhea or constipation.   Denies dysuria, frequency, hesitancy or incontinence. Denies joint pain, swelling and limitation in mobility. Denies headaches, seizures, numbness, or tingling.  Denies skin break down or rash.        Objective:   Physical Exam Patient alert and oriented and in no cardiopulmonary distress.  HEENT: No facial asymmetry, EOMI, maxillary sinus tenderness,  oropharynx pink and moist.  Neck supple no adenopathy.  Chest: Clear to auscultation bilaterally.  CVS: S1, S2 no murmurs, no S3.  ABD: Soft non tender. .  Ext: No edema  MS: Adequate ROM spine, shoulders, hips and knees.  Skin: Intact, no ulcerations or rash noted.  Psych: Good eye contact, normal affect. Memory intact not anxious or depressed appearing.  CNS: CN 2-12 intact, power,  normal throughout.        Assessment  & Plan:

## 2013-09-04 NOTE — Assessment & Plan Note (Signed)
Uncontrolled, change to zestoretic, continue amlodipine DASH diet and commitment to daily physical activity for a minimum of 30 minutes discussed and encouraged, as a part of hypertension management. The importance of attaining a healthy weight is also discussed.

## 2013-09-06 ENCOUNTER — Encounter: Payer: Self-pay | Admitting: Family Medicine

## 2013-09-06 DIAGNOSIS — E785 Hyperlipidemia, unspecified: Secondary | ICD-10-CM | POA: Insufficient documentation

## 2013-09-06 DIAGNOSIS — E8881 Metabolic syndrome: Secondary | ICD-10-CM | POA: Insufficient documentation

## 2013-09-06 DIAGNOSIS — R7303 Prediabetes: Secondary | ICD-10-CM | POA: Insufficient documentation

## 2013-10-19 ENCOUNTER — Ambulatory Visit: Payer: Managed Care, Other (non HMO) | Admitting: Family Medicine

## 2013-10-22 ENCOUNTER — Encounter (INDEPENDENT_AMBULATORY_CARE_PROVIDER_SITE_OTHER): Payer: Self-pay

## 2013-10-22 ENCOUNTER — Encounter: Payer: Self-pay | Admitting: Family Medicine

## 2013-10-22 ENCOUNTER — Ambulatory Visit (INDEPENDENT_AMBULATORY_CARE_PROVIDER_SITE_OTHER): Payer: Managed Care, Other (non HMO) | Admitting: Family Medicine

## 2013-10-22 ENCOUNTER — Telehealth (HOSPITAL_COMMUNITY): Payer: Self-pay | Admitting: Dietician

## 2013-10-22 VITALS — BP 124/90 | HR 98 | Resp 16 | Wt 214.0 lb

## 2013-10-22 DIAGNOSIS — Z23 Encounter for immunization: Secondary | ICD-10-CM

## 2013-10-22 DIAGNOSIS — E8881 Metabolic syndrome: Secondary | ICD-10-CM

## 2013-10-22 DIAGNOSIS — R7309 Other abnormal glucose: Secondary | ICD-10-CM

## 2013-10-22 DIAGNOSIS — E785 Hyperlipidemia, unspecified: Secondary | ICD-10-CM

## 2013-10-22 DIAGNOSIS — R7303 Prediabetes: Secondary | ICD-10-CM

## 2013-10-22 DIAGNOSIS — J309 Allergic rhinitis, unspecified: Secondary | ICD-10-CM

## 2013-10-22 DIAGNOSIS — I10 Essential (primary) hypertension: Secondary | ICD-10-CM

## 2013-10-22 DIAGNOSIS — E669 Obesity, unspecified: Secondary | ICD-10-CM

## 2013-10-22 DIAGNOSIS — R079 Chest pain, unspecified: Secondary | ICD-10-CM | POA: Insufficient documentation

## 2013-10-22 MED ORDER — FLUTICASONE PROPIONATE 50 MCG/ACT NA SUSP: 2.0000 | Freq: Every day | NASAL | Status: DC

## 2013-10-22 NOTE — Telephone Encounter (Signed)
Received referral via fax from Dr. Moshe Cipro for dx: HTN. Sent letter to pt home via [place] [employer] in attempt to contact pt to schedule appointment.

## 2013-10-22 NOTE — Progress Notes (Signed)
Subjective:    Patient ID: Bobby Delacruz, male    DOB: 02/19/1961, 53 y.o.   MRN: [id number]  HPI The PT is here for follow up and re-evaluation of chronic medical conditions, medication management and review of any available recent lab and radiology data.  Preventive health is updated, specifically  Cancer screening and Immunization.  Will accept flu vaccine today He has worked on lifestyle change since last visit, no weight loss success yet . The PT denies any adverse reactions to current medications since the last visit.  3 month h/o intermittent chest pain, no aggravating or relieving factors, no associated nausea diaphoresis or light headedness. H/o enlarged heart , new dx of metabolic syndrome , dyslipidemia and prediabetes, all increase risk of CVD, so l will refer for cardiology eval     Review of Systems See HPI Denies recent fever or chills. Denies sinus pressure, nasal congestion, ear pain or sore throat. Denies chest congestion, productive cough or wheezing. Denies PND, orthopnea, palpitations and leg swelling Denies abdominal pain, nausea, vomiting,diarrhea or constipation.   Denies dysuria, frequency, hesitancy or incontinence. Denies joint pain, swelling and limitation in mobility. Denies headaches, seizures, numbness, or tingling. Denies depression, anxiety or insomnia. Denies skin break down or rash.        Objective:   Physical Exam  BP 124/90  Pulse 98  Resp 16  Wt 214 lb (97.07 kg)  SpO2 98% Patient alert and oriented and in no cardiopulmonary distress.  HEENT: No facial asymmetry, EOMI, no sinus tenderness,  oropharynx pink and moist.  Neck supple no adenopathy.  Chest: Clear to auscultation bilaterally.  CVS: S1, S2 no murmurs, no S3.  ABD: Soft non tender. Bowel sounds normal.  Ext: No edema  MS: Adequate ROM spine, shoulders, hips and knees.  Skin: Intact, no ulcerations or rash noted.  Psych: Good eye contact, normal affect. Memory  intact not anxious or depressed appearing.  CNS: CN 2-12 intact, power, tone and sensation normal throughout.       Assessment & Plan:  HTN (hypertension) Diastolic BP still elevated. No additional med change at this time DASH diet and commitment to daily physical activity for a minimum of 30 minutes discussed and encouraged, as a part of hypertension management. The importance of attaining a healthy weight is also discussed.   Prediabetes Patient educated about the importance of limiting  Carbohydrate intake , the need to commit to daily physical activity for a minimum of 30 minutes , and to commit weight loss. The fact that changes in all these areas will reduce or eliminate all together the development of diabetes is stressed.   Referred for individual conseling also Updated lab needed at/ before next visit.   Dyslipidemia Hyperlipidemia:Low fat diet discussed and encouraged.  Updated lab needed at/ before next visit.   Metabolic syndrome X The increased risk of cardiovascular disease associated with this diagnosis, and the need to consistently work on lifestyle to change this is discussed. Following  a  heart healthy diet ,commitment to 30 minutes of exercise at least 5 days per week, as well as control of blood sugar and cholesterol , and achieving a healthy weight are all the areas to be addressed .   Chest pain, unspecified Chest pain with several risk factors for CAD. EKG shows LVH , no ischemia, sinus rhythm, refer to cardiology for further eval  Allergic rhinitis Increased symptoms as Spring approaches, flonase prescribed  Obesity, Class I, BMI 30-34.9 unchanged Patient re-educated  about  the importance of commitment to a  minimum of 150 minutes of exercise per week. The importance of healthy food choices with portion control discussed. Encouraged to start a food diary, count calories and to consider  joining a support group. Sample diet sheets offered. Goals  set by the patient for the next several months.

## 2013-10-22 NOTE — Patient Instructions (Addendum)
F/u in 3 month, call if you need me before  Flu vac today  You are referred to cardiology.  Blood pressure is still not at goal , so please follow DASH diet and work on exercise  Flonase sent in for allergies   HBA1C, fasting lipid and chem 7 and HBA1C in 3 month, before follow up  You are referred for individual nutritional counseling, the time spent with this is very well worth it!  It is important that you exercise regularly at least 30 minutes 7 times a week. If you develop chest pain, have severe difficulty breathing, or feel very tired, stop exercising immediately and seek medical attention   A healthy diet is rich in fruit, vegetables and whole grains. Poultry fish, nuts and beans are a healthy choice for protein rather then red meat. A low sodium diet and drinking 64 ounces of water daily is generally recommended. Oils and sweet should be limited. Carbohydrates especially for those who are diabetic or overweight, should be limited to 45 to 60 gram per meal. It is important to eat on a regular schedule, at least 3 times daily. Snacks should be primarily fruits, vegetables or nuts.  Weight loss goal of 2 pounds per month  Metabolic Syndrome, Adult Metabolic syndrome descibes a group of risk factors for heart disease and diabetes. This syndrome has other names including Insulin Resistance Syndrome. The more risk factors you have, the higher your risk of having a heart attack, stroke, or developing diabetes. These risk factors include:  High blood sugar.  High blood triglyceride (a fat found in the blood) level.  High blood pressure.  Abdominal obesity (your extra weight is around your waist instead of your hips).  Low levels of high-density lipoprotein, HDL (good blood cholesterol). If you have any three of these risk factors, you have metabolic syndrome. If you have even one of these factors, you should make lifestyle changes to improve your health in order to prevent serious  health diseases.  In people with metabolic syndrome, the cells do not respond properly to insulin. This can lead to high levels of glucose in the blood, which can interfere with normal body processes. Eventually, this can cause high blood pressure and higher fat levels in the blood, and inflammation of your blood vessels. The result can be heart disease and stroke.  CAUSES   Eating a diet rich in calories and saturated fat.  Too little physical activity.  Being overweight. Other underlying causes are:  Family history (genetics).  Ethnicity (South Asians are at a higher risk).  Older age (your chances of developing metabolic syndrome are higher as you grow older).  Insulin resistance. SYMPTOMS  By itself, metabolic syndrome has no symptoms. However, you might have symptoms of diabetes (high blood sugar) or high blood pressure, such as:  Increased thirst, urination, and tiredness.  Dizzy spells.  Dull headaches that are unusual for you.  Blurred vision.  Nosebleeds. DIAGNOSIS  Your caregiver may make a diagnosis of metabolic syndrome if you have at least three of these factors:  If you are overweight mostly around the waist. This means a waistline greater than 40" in men and more than 35" in women. The waistline limits are 31 to 35 inches for women and 37 to 39 inches for men. In those who have certain genetic risk factors, such as having a family history of diabetes or being of Asian descent.  If you have a blood pressure of 130/85 mm Hg or more,  or if you are being treated for high blood pressure.  If your blood triglyceride level is 150 mg/dL or more, or you are being treated for high levels of triglyceride.  If the level of HDL in your blood is below 40 mg/dL in men, less than 50 mg/dL in women, or you are receiving treatment for low levels of HDL.  If the level of sugar in your blood is high with fasting blood sugar level of 110 mg/dL or more, or you are under treatment  for diabetes. TREATMENT  Your caregiver may have you make lifestyle changes, which may include:  Exercise.  Losing weight.  Maintaining a healthy diet.  Quitting smoking. The lifestyle changes listed above are key in reducing your risk for heart disease and stroke. Medicines may also be prescribed to help your body respond to insulin better and to reduce your blood pressure and blood fat levels. Aspirin may be recommended to reduce risks of heart disease or stroke.  HOME CARE INSTRUCTIONS   Exercise.  Measure your waist at regular intervals just above the hipbones after you have breathed out.  Maintain a healthy diet.  Eat fruits, such as apples, oranges, and pears.  Eat vegetables.  Eat legumes, such as kidney beans, peas, and lentils.  Eat food rich in soluble fiber, such as whole grain cereal, oatmeal, and oat bran.  Use olive or safflower oils and avoid saturated fats.  Eat nuts.  Limit the amount of salt you eat or add to food.  Limit the amount of alcohol you drink.  Include fish in your diet, if possible.  Stop smoking if you are a smoker.  Maintain regular follow-up appointments.  Follow your caregiver's advice. SEEK MEDICAL CARE IF:   You feel very tired or fatigued.  You develop excessive thirst.  You pass large quantities of urine.  You are putting on weight around your waist rather than losing weight.  You develop headaches over and over again.  You have off-and-on dizzy spells. SEEK IMMEDIATE MEDICAL CARE IF:   You develop nosebleeds.  You develop sudden blurred vision.  You develop sudden dizzy spells.  You develop chest pains, trouble breathing, or feel an abnormal or irregular heart beat.  You have a fainting episode.  You develop any sudden trouble speaking and/or swallowing.  You develop sudden weakness in one arm and/or one leg. MAKE SURE YOU:   Understand these instructions.  Will watch your condition.  Will get help  right away if you are not doing well or get worse. Document Released: 11/06/2007 Document Revised: 10/22/2011 Document Reviewed: 11/06/2007 [facility] Patient Information 2014 [place], [place]. DASH Diet The DASH diet stands for "Dietary Approaches to Stop Hypertension." It is a healthy eating plan that has been shown to reduce high blood pressure (hypertension) in as little as 14 days, while also possibly providing other significant health benefits. These other health benefits include reducing the risk of breast cancer after menopause and reducing the risk of type 2 diabetes, heart disease, colon cancer, and stroke. Health benefits also include weight loss and slowing kidney failure in patients with chronic kidney disease.  DIET GUIDELINES  Limit salt (sodium). Your diet should contain less than 1500 mg of sodium daily.  Limit refined or processed carbohydrates. Your diet should include mostly whole grains. Desserts and added sugars should be used sparingly.  Include small amounts of heart-healthy fats. These types of fats include nuts, oils, and tub margarine. Limit saturated and trans fats. These fats have  been shown to be harmful in the body. CHOOSING FOODS  The following food groups are based on a 2000 calorie diet. See your Registered Dietitian for individual calorie needs. Grains and Grain Products (6 to 8 servings daily)  Eat More Often: Whole-wheat bread, brown rice, whole-grain or wheat pasta, quinoa, popcorn without added fat or salt (air popped).  Eat Less Often: White bread, white pasta, white rice, cornbread. Vegetables (4 to 5 servings daily)  Eat More Often: Fresh, frozen, and canned vegetables. Vegetables may be raw, steamed, roasted, or grilled with a minimal amount of fat.  Eat Less Often/Avoid: Creamed or fried vegetables. Vegetables in a cheese sauce. Fruit (4 to 5 servings daily)  Eat More Often: All fresh, canned (in natural juice), or frozen fruits. Dried fruits without  added sugar. One hundred percent fruit juice ( cup [237 mL] daily).  Eat Less Often: Dried fruits with added sugar. Canned fruit in light or heavy syrup. [employer], Fish, and Poultry (2 servings or less daily. One serving is 3 to 4 oz [85-114 g]).  Eat More Often: Ninety percent or leaner ground beef, tenderloin, sirloin. Round cuts of beef, chicken breast, [place] breast. All fish. Grill, bake, or broil your meat. Nothing should be fried.  Eat Less Often/Avoid: Fatty cuts of meat, [place], or chicken leg, thigh, or wing. Fried cuts of meat or fish. Dairy (2 to 3 servings)  Eat More Often: Low-fat or fat-free milk, low-fat plain or light yogurt, reduced-fat or part-skim cheese.  Eat Less Often/Avoid: Milk (whole, 2%).Whole milk yogurt. Full-fat cheeses. Nuts, Seeds, and Legumes (4 to 5 servings per week)  Eat More Often: All without added salt.  Eat Less Often/Avoid: Salted nuts and seeds, canned beans with added salt. Fats and Sweets (limited)  Eat More Often: Vegetable oils, tub margarines without trans fats, sugar-free gelatin. Mayonnaise and salad dressings.  Eat Less Often/Avoid: Coconut oils, palm oils, butter, stick margarine, cream, half and half, cookies, candy, pie. FOR MORE INFORMATION The Dash Diet Eating Plan: www.dashdiet.org Document Released: 07/19/2011 Document Revised: 10/22/2011 Document Reviewed: 07/19/2011 [facility] Patient Information 2014 [place], [place].

## 2013-10-25 NOTE — Assessment & Plan Note (Signed)
Patient educated about the importance of limiting  Carbohydrate intake , the need to commit to daily physical activity for a minimum of 30 minutes , and to commit weight loss. The fact that changes in all these areas will reduce or eliminate all together the development of diabetes is stressed.   Referred for individual conseling also Updated lab needed at/ before next visit.

## 2013-10-25 NOTE — Assessment & Plan Note (Signed)
Diastolic BP still elevated. No additional med change at this time DASH diet and commitment to daily physical activity for a minimum of 30 minutes discussed and encouraged, as a part of hypertension management. The importance of attaining a healthy weight is also discussed.

## 2013-10-25 NOTE — Assessment & Plan Note (Signed)
Increased symptoms as Spring approaches, flonase prescribed

## 2013-10-25 NOTE — Assessment & Plan Note (Signed)
The increased risk of cardiovascular disease associated with this diagnosis, and the need to consistently work on lifestyle to change this is discussed. Following  a  heart healthy diet ,commitment to 30 minutes of exercise at least 5 days per week, as well as control of blood sugar and cholesterol , and achieving a healthy weight are all the areas to be addressed .  

## 2013-10-25 NOTE — Assessment & Plan Note (Signed)
Chest pain with several risk factors for CAD. EKG shows LVH , no ischemia, sinus rhythm, refer to cardiology for further eval

## 2013-10-25 NOTE — Assessment & Plan Note (Addendum)
unchanged Patient re-educated about  the importance of commitment to a  minimum of 150 minutes of exercise per week. The importance of healthy food choices with portion control discussed. Encouraged to start a food diary, count calories and to consider  joining a support group. Sample diet sheets offered. Goals set by the patient for the next several months.    

## 2013-10-25 NOTE — Assessment & Plan Note (Signed)
Hyperlipidemia:Low fat diet discussed and encouraged.  Updated lab needed at/ before next visit.  

## 2013-11-05 NOTE — Telephone Encounter (Signed)
Pt has not responded to attempts to contact (10/22/13, 10/29/13)  to schedule appointment. Referral filed.

## 2013-11-18 ENCOUNTER — Ambulatory Visit: Payer: Managed Care, Other (non HMO) | Admitting: Cardiology

## 2013-12-11 ENCOUNTER — Encounter: Payer: Self-pay | Admitting: Cardiology

## 2013-12-11 ENCOUNTER — Ambulatory Visit (INDEPENDENT_AMBULATORY_CARE_PROVIDER_SITE_OTHER): Payer: Managed Care, Other (non HMO) | Admitting: Cardiology

## 2013-12-11 ENCOUNTER — Encounter (INDEPENDENT_AMBULATORY_CARE_PROVIDER_SITE_OTHER): Payer: Self-pay

## 2013-12-11 ENCOUNTER — Encounter: Payer: Self-pay | Admitting: *Deleted

## 2013-12-11 VITALS — BP 128/88 | HR 80 | Ht 68.0 in | Wt 211.0 lb

## 2013-12-11 DIAGNOSIS — R079 Chest pain, unspecified: Secondary | ICD-10-CM

## 2013-12-11 NOTE — Patient Instructions (Signed)
Your physician recommends that you schedule a follow-up appointment in: to be determined. We will call with the test results.  Your physician has requested that you have a stress echocardiogram. For further information please visit [email or web address]. Please follow instruction sheet as given.

## 2013-12-11 NOTE — Progress Notes (Signed)
Clinical Summary Bobby Delacruz is a 53 y.o.male seen today as a new patient. He is referred for chest pain.  1. Chest pain - started approx 4-5 months ago. Aching pain left chest, 5/10. Typically occurs at rest. No other associated symptoms. Nothing makes better or worst. Lasts for approx 10-15 minutes Occurs approx once a month. No relation to food - stress echo 02/2012 negative for ischemia - denies DOE. Occas orthopnea. No LE edema CAD risk factors: Age, HL, HTN, prediabetes, former tobacco x 6 years, quit 12 years ago. Father with"  heart troubles"   Past Medical History  Diagnosis Date  . Enlarged heart   . Migraine   . Hypertension 2009  . Cancer   . Kidney cancer, primary, with metastasis from kidney to other site 2011, left    partial nephrectomy , cured     No Known Allergies   Current Outpatient Prescriptions  Medication Sig Dispense Refill  . amLODipine (NORVASC) 5 MG tablet Take 5 mg by mouth daily.      . fish oil-omega-3 fatty acids 1000 MG capsule Take 1 g by mouth daily.      . fluticasone (FLONASE) 50 MCG/ACT nasal spray Place 2 sprays into both nostrils daily.  16 g  6  . lisinopril (PRINIVIL,ZESTRIL) 20 MG tablet Take 20 mg by mouth daily.      Marland Kitchen lisinopril-hydrochlorothiazide (ZESTORETIC) 20-12.5 MG per tablet Take 1 tablet by mouth daily.  30 tablet  5  . Multiple Vitamin (MULTIVITAMIN WITH MINERALS) TABS Take 1 tablet by mouth daily.      . vitamin C (ASCORBIC ACID) 500 MG tablet Take 500 mg by mouth daily.       No current facility-administered medications for this visit.     Past Surgical History  Procedure Laterality Date  . Laparoscopic nephrectomy Left 2011    [facility]      No Known Allergies    Family History  Problem Relation Age of Onset  . Hypertension Mother   . Hypertension Father   . Hypertension Sister      Social History Mr. Ladson reports that he quit smoking about 11 years ago. He does not have any smokeless tobacco  history on file. Mr. Daniello reports that he drinks alcohol.   Review of Systems CONSTITUTIONAL: No weight loss, fever, chills, weakness or fatigue.  HEENT: Eyes: No visual loss, blurred vision, double vision or yellow sclerae.No hearing loss, sneezing, congestion, runny nose or sore throat.  SKIN: No rash or itching.  CARDIOVASCULAR: per hpi RESPIRATORY: No shortness of breath, cough or sputum.  GASTROINTESTINAL: No anorexia, nausea, vomiting or diarrhea. No abdominal pain or blood.  GENITOURINARY: No burning on urination, no polyuria NEUROLOGICAL: No headache, dizziness, syncope, paralysis, ataxia, numbness or tingling in the extremities. No change in bowel or bladder control.  MUSCULOSKELETAL: No muscle, back pain, joint pain or stiffness.  LYMPHATICS: No enlarged nodes. No history of splenectomy.  PSYCHIATRIC: No history of depression or anxiety.  ENDOCRINOLOGIC: No reports of sweating, cold or heat intolerance. No polyuria or polydipsia.  Marland Kitchen   Physical Examination p 80 bp 128/88 Wt 211 lbs BMI 32 Gen: resting comfortably, no acute distress HEENT: no scleral icterus, pupils equal round and reactive, no palptable cervical adenopathy,  CV: RRR, no m/r/g, no JVD, no carotid bruits Resp: Clear to auscultation bilaterally GI: abdomen is soft, non-tender, non-distended, normal bowel sounds, no hepatosplenomegaly MSK: extremities are warm, no edema.  Skin: warm, no rash Neuro:  no focal deficits Psych: appropriate affect   Diagnostic Studies 02/2012 Stress Echo Baseline:  - LV global systolic function was normal. - Normal wall motion; no LV regional wall motion abnormalities. Peak stress:  - LV global systolic function was vigorous. - No evidence for new LV regional wall motion abnormalities.      Assessment and Plan   1. Chest pain - possible cardiac etiology, will obtain stress echo to further evaluate     Arnoldo Lenis, M.D., F.A.C.C.

## 2013-12-25 ENCOUNTER — Encounter (HOSPITAL_COMMUNITY): Payer: Self-pay

## 2013-12-25 ENCOUNTER — Ambulatory Visit (HOSPITAL_COMMUNITY)
Admission: RE | Admit: 2013-12-25 | Discharge: 2013-12-25 | Disposition: A | Discharge status: Home / Self Care | Payer: Managed Care, Other (non HMO) | Source: Ambulatory Visit | Attending: Cardiology | Admitting: Cardiology

## 2013-12-25 DIAGNOSIS — Z87891 Personal history of nicotine dependence: Secondary | ICD-10-CM | POA: Insufficient documentation

## 2013-12-25 DIAGNOSIS — I498 Other specified cardiac arrhythmias: Secondary | ICD-10-CM | POA: Insufficient documentation

## 2013-12-25 DIAGNOSIS — I10 Essential (primary) hypertension: Secondary | ICD-10-CM | POA: Insufficient documentation

## 2013-12-25 DIAGNOSIS — R072 Precordial pain: Secondary | ICD-10-CM

## 2013-12-25 DIAGNOSIS — E785 Hyperlipidemia, unspecified: Secondary | ICD-10-CM | POA: Insufficient documentation

## 2013-12-25 DIAGNOSIS — R079 Chest pain, unspecified: Secondary | ICD-10-CM

## 2013-12-25 NOTE — Progress Notes (Signed)
Stress Lab Nurses Notes - Casson Catena Isa 12/25/2013 Reason for doing test: Chest Pain Type of test: Stress Echo Nurse performing test: Gerrit Halls, RN Nuclear Medicine Tech: Not Applicable Echo Tech: Leavy Cella MD performing test: Branch/K.Lawrence NP Family MD: Moshe Cipro Test explained and consent signed: yes IV started: No IV started Symptoms: Fatigue in legs Treatment/Intervention: None Reason test stopped: reached target HR After recovery IV was: na Patient to return to [place]. Med at : NA Patient discharged: Home Patient's Condition upon discharge was: stable Comments: During test peak BP 164/80 & HR 187.  Recovery BP 124/95 & HR 106 .  Symptoms resolved in recovery. Donnajean Lopes

## 2013-12-25 NOTE — Progress Notes (Signed)
*  PRELIMINARY RESULTS* Echocardiogram Echocardiogram Stress Test has been performed.  Elvia Collum 12/25/2013, 12:34 PM

## 2013-12-31 ENCOUNTER — Telehealth: Payer: Self-pay | Admitting: *Deleted

## 2013-12-31 ENCOUNTER — Encounter: Payer: Self-pay | Admitting: *Deleted

## 2013-12-31 DIAGNOSIS — R079 Chest pain, unspecified: Secondary | ICD-10-CM

## 2013-12-31 NOTE — Telephone Encounter (Signed)
Ordered Lexi per Dr Harl Bowie. Set for January 18, 2014 at 800 am

## 2013-12-31 NOTE — Telephone Encounter (Signed)
Message copied by Truett Mainland on Thu Dec 31, 2013  2:49 PM ------      Message from: Zeeland F      Created: Wed Dec 30, 2013  2:09 PM       Add on to prior note            He needs to hold his norvasc the day of the test. ------

## 2014-01-15 ENCOUNTER — Telehealth: Payer: Self-pay | Admitting: Family Medicine

## 2014-01-15 DIAGNOSIS — M549 Dorsalgia, unspecified: Secondary | ICD-10-CM

## 2014-01-18 ENCOUNTER — Encounter (HOSPITAL_COMMUNITY): Payer: Managed Care, Other (non HMO)

## 2014-01-18 ENCOUNTER — Other Ambulatory Visit (HOSPITAL_COMMUNITY): Payer: Managed Care, Other (non HMO)

## 2014-01-18 NOTE — Addendum Note (Signed)
Addended by: Eual Fines on: 01/18/2014 01:30 PM   Modules accepted: Orders

## 2014-01-18 NOTE — Telephone Encounter (Signed)
pls enter referral for Back pain, that's all I can assume he has as there is nothing on record, I will sign

## 2014-01-18 NOTE — Telephone Encounter (Signed)
Pt referred as requested

## 2014-01-18 NOTE — Telephone Encounter (Signed)
States for the past few months he has had trouble with his left arm hurting. Hurts a lot, rates pain at an 8. Has some tingling and numbness all the way down to his fingertips. Wants referral to Dr Cathrine Muster

## 2014-01-22 ENCOUNTER — Encounter (HOSPITAL_COMMUNITY): Payer: Managed Care, Other (non HMO)

## 2014-01-22 ENCOUNTER — Ambulatory Visit (HOSPITAL_COMMUNITY): Admission: RE | Admit: 2014-01-22 | Payer: Managed Care, Other (non HMO) | Source: Ambulatory Visit

## 2014-01-28 ENCOUNTER — Ambulatory Visit: Payer: Managed Care, Other (non HMO) | Admitting: Family Medicine

## 2014-04-06 ENCOUNTER — Telehealth: Payer: Self-pay | Admitting: *Deleted

## 2014-04-06 DIAGNOSIS — M545 Low back pain: Principal | ICD-10-CM

## 2014-04-06 DIAGNOSIS — G8929 Other chronic pain: Secondary | ICD-10-CM

## 2014-04-06 NOTE — Telephone Encounter (Signed)
Pt's wife called about pt and wanting to speak with Brandi. Please advise [phone]

## 2014-04-07 NOTE — Addendum Note (Signed)
Addended by: Eual Fines on: 04/07/2014 01:35 PM   Modules accepted: Orders

## 2014-04-07 NOTE — Telephone Encounter (Signed)
Ok to refer if the Doc just accepts referral without any imaging studies being done, pls do the background work to check on this , thanks

## 2014-04-07 NOTE — Telephone Encounter (Signed)
Wants referral to Dr that his wife sees (pain clinic) Dr Everlean Cherry. Lower back pain x 1 year off and on. Going down left leg, getting worse recently. Please advise

## 2014-04-07 NOTE — Telephone Encounter (Signed)
Patient referred.

## 2014-04-26 ENCOUNTER — Other Ambulatory Visit: Payer: Self-pay | Admitting: Family Medicine

## 2014-05-05 ENCOUNTER — Telehealth: Payer: Self-pay

## 2014-05-05 DIAGNOSIS — R7303 Prediabetes: Secondary | ICD-10-CM

## 2014-05-05 DIAGNOSIS — E785 Hyperlipidemia, unspecified: Secondary | ICD-10-CM

## 2014-05-05 NOTE — Telephone Encounter (Signed)
Appointment reminder letter sent.  Lab order sent.

## 2014-06-15 ENCOUNTER — Telehealth: Payer: Self-pay | Admitting: *Deleted

## 2014-06-15 NOTE — Telephone Encounter (Signed)
Pt called requesting to speak with Velna Hatchet, pt asked for Brandi to call him back. Please advise [phone]

## 2014-06-15 NOTE — Telephone Encounter (Signed)
Wanted to know if we were taking new patients- I advised not at this time

## 2014-07-15 ENCOUNTER — Ambulatory Visit (INDEPENDENT_AMBULATORY_CARE_PROVIDER_SITE_OTHER): Payer: Managed Care, Other (non HMO)

## 2014-07-15 ENCOUNTER — Encounter: Payer: Self-pay | Admitting: Family Medicine

## 2014-07-15 ENCOUNTER — Ambulatory Visit (INDEPENDENT_AMBULATORY_CARE_PROVIDER_SITE_OTHER): Payer: Managed Care, Other (non HMO) | Admitting: Family Medicine

## 2014-07-15 VITALS — BP 120/82 | HR 94 | Resp 16 | Ht 68.0 in | Wt 200.0 lb

## 2014-07-15 DIAGNOSIS — Z125 Encounter for screening for malignant neoplasm of prostate: Secondary | ICD-10-CM

## 2014-07-15 DIAGNOSIS — E669 Obesity, unspecified: Secondary | ICD-10-CM

## 2014-07-15 DIAGNOSIS — F419 Anxiety disorder, unspecified: Secondary | ICD-10-CM

## 2014-07-15 DIAGNOSIS — F5105 Insomnia due to other mental disorder: Secondary | ICD-10-CM

## 2014-07-15 DIAGNOSIS — E8881 Metabolic syndrome: Secondary | ICD-10-CM

## 2014-07-15 DIAGNOSIS — I889 Nonspecific lymphadenitis, unspecified: Secondary | ICD-10-CM

## 2014-07-15 DIAGNOSIS — I10 Essential (primary) hypertension: Secondary | ICD-10-CM

## 2014-07-15 DIAGNOSIS — Z23 Encounter for immunization: Secondary | ICD-10-CM

## 2014-07-15 DIAGNOSIS — E785 Hyperlipidemia, unspecified: Secondary | ICD-10-CM

## 2014-07-15 DIAGNOSIS — R7309 Other abnormal glucose: Secondary | ICD-10-CM

## 2014-07-15 DIAGNOSIS — R7303 Prediabetes: Secondary | ICD-10-CM

## 2014-07-15 MED ORDER — TEMAZEPAM 7.5 MG PO CAPS: 7.5000 mg | ORAL_CAPSULE | Freq: Every evening | ORAL | Status: DC | PRN

## 2014-07-15 MED ORDER — DOXYCYCLINE HYCLATE 100 MG PO TABS: 100.0000 mg | ORAL_TABLET | Freq: Two times a day (BID) | ORAL | Status: DC

## 2014-07-15 NOTE — Patient Instructions (Addendum)
F/u in 4.5 month with rectal  Flu vaccine today  Antibiotic sent in for swollen tender right gland   Congrats on weight losss New for sleep is restoril, practice good hygiene please   HBA1C, pSa, chem 7 today   CBC, fasting lipid, chem 7, HBA1C , TSH in 4.5 month

## 2014-07-15 NOTE — Progress Notes (Signed)
   Subjective:    Patient ID: Bobby Delacruz, male    DOB: 04/26/1961, 53 y.o.   MRN: [id number]  HPI The PT is here for follow up and re-evaluation of chronic medical conditions, medication management and review of any available recent lab and radiology data.  Preventive health is updated, specifically  Cancer screening and Immunization.   Questions or concerns regarding consultations or procedures which the PT has had in the interim are  addressed. The PT denies any adverse reactions to current medications since the last visit.  1 day h/o swollen painful rigthside of neck, no trauma, fever or dental problems Has lost weight through dietary change wants to improve health       Review of Systems See HPI Denies recent fever or chills. Denies sinus pressure, nasal congestion, ear pain or sore throat. Denies chest congestion, productive cough or wheezing. Denies chest pains, palpitations and leg swelling Denies abdominal pain, nausea, vomiting,diarrhea or constipation.   Denies dysuria, frequency, hesitancy or incontinence. Denies joint pain, swelling and limitation in mobility. Denies headaches, seizures, numbness, or tingling. Denies depression, anxiety or insomnia. Denies skin break down or rash.        Objective:   Physical Exam BP 120/82 mmHg  Pulse 94  Resp 16  Ht 5\' 8"  (1.727 m)  Wt 200 lb (90.719 kg)  BMI 30.42 kg/m2  SpO2 100% Patient alert and oriented and in no cardiopulmonary distress.  HEENT: No facial asymmetry, EOMI,   oropharynx pink and moist.  Neck supple no JVD, tender right submandibular adenitis, TM clear.  Chest: Clear to auscultation bilaterally.  CVS: S1, S2 no murmurs, no S3.Regular rate.  ABD: Soft non tender.   Ext: No edema  MS: Adequate ROM spine, shoulders, hips and knees.  Skin: Intact, no ulcerations or rash noted.  Psych: Good eye contact, normal affect. Memory intact not anxious or depressed appearing.  CNS: CN 2-12 intact,  power,  normal throughout.no focal deficits noted.        Assessment & Plan:  Cervical adenitis Right submandibular adenitis, antibiotic prescribed  Insomnia secondary to anxiety Sleep hygiene reviewed and written information offered also. Prescription sent for  medication needed.   Obesity, Class I, BMI 30-34.9 Improved. Pt applauded on succesful weight loss through lifestyle change, and encouraged to continue same. Weight loss goal set for the next several months.   Prediabetes Improved, HBa1C is 6.0 in 07/2014 .gliuc1 Updated lab needed at/ before next visit.   HTN (hypertension) Controlled, no change in medication DASH diet and commitment to daily physical activity for a minimum of 30 minutes discussed and encouraged, as a part of hypertension management. The importance of attaining a healthy weight is also discussed.   Need for prophylactic vaccination and inoculation against influenza Vaccine administered at visit.   Dyslipidemia Hyperlipidemia:Low fat diet discussed and encouraged.  Updated lab needed at/ before next visit.   Metabolic syndrome X The increased risk of cardiovascular disease associated with this diagnosis, and the need to consistently work on lifestyle to change this is discussed. Following  a  heart healthy diet ,commitment to 30 minutes of exercise at least 5 days per week, as well as control of blood sugar and cholesterol , and achieving a healthy weight are all the areas to be addressed .

## 2014-07-16 LAB — BASIC METABOLIC PANEL
BUN: 15 mg/dL (ref 6–23)
CALCIUM: 9.9 mg/dL (ref 8.4–10.5)
CHLORIDE: 100 meq/L (ref 96–112)
CO2: 28 meq/L (ref 19–32)
CREATININE: 1.08 mg/dL (ref 0.50–1.35)
Glucose, Bld: 88 mg/dL (ref 70–99)
Potassium: 4.3 mEq/L (ref 3.5–5.3)
Sodium: 137 mEq/L (ref 135–145)

## 2014-07-16 LAB — HEMOGLOBIN A1C
Hgb A1c MFr Bld: 6 % — ABNORMAL HIGH (ref <5.7)
MEAN PLASMA GLUCOSE: 126 mg/dL — AB (ref <117)

## 2014-07-16 LAB — PSA: PSA: 0.74 ng/mL (ref <=4.00)

## 2014-07-18 DIAGNOSIS — Z23 Encounter for immunization: Secondary | ICD-10-CM | POA: Insufficient documentation

## 2014-07-18 NOTE — Assessment & Plan Note (Signed)
Hyperlipidemia:Low fat diet discussed and encouraged.  Updated lab needed at/ before next visit.  

## 2014-07-18 NOTE — Assessment & Plan Note (Signed)
The increased risk of cardiovascular disease associated with this diagnosis, and the need to consistently work on lifestyle to change this is discussed. Following  a  heart healthy diet ,commitment to 30 minutes of exercise at least 5 days per week, as well as control of blood sugar and cholesterol , and achieving a healthy weight are all the areas to be addressed .  

## 2014-07-18 NOTE — Assessment & Plan Note (Signed)
Sleep hygiene reviewed and written information offered also. Prescription sent for  medication needed.  

## 2014-07-18 NOTE — Assessment & Plan Note (Signed)
Improved, HBa1C is 6.0 in 07/2014 .gliuc1 Updated lab needed at/ before next visit.

## 2014-07-18 NOTE — Assessment & Plan Note (Signed)
Right submandibular adenitis, antibiotic prescribed

## 2014-07-18 NOTE — Assessment & Plan Note (Signed)
Vaccine administered at visit.  

## 2014-07-18 NOTE — Assessment & Plan Note (Signed)
Improved. Pt applauded on succesful weight loss through lifestyle change, and encouraged to continue same. Weight loss goal set for the next several months.  

## 2014-07-18 NOTE — Assessment & Plan Note (Signed)
Controlled, no change in medication DASH diet and commitment to daily physical activity for a minimum of 30 minutes discussed and encouraged, as a part of hypertension management. The importance of attaining a healthy weight is also discussed.  

## 2014-07-29 ENCOUNTER — Other Ambulatory Visit: Payer: Self-pay | Admitting: Family Medicine

## 2014-08-30 ENCOUNTER — Other Ambulatory Visit: Payer: Self-pay | Admitting: Family Medicine

## 2014-10-15 ENCOUNTER — Telehealth: Payer: Self-pay

## 2014-10-15 DIAGNOSIS — L03012 Cellulitis of left finger: Secondary | ICD-10-CM | POA: Diagnosis not present

## 2014-10-15 DIAGNOSIS — Z85528 Personal history of other malignant neoplasm of kidney: Secondary | ICD-10-CM | POA: Insufficient documentation

## 2014-10-15 DIAGNOSIS — Z7951 Long term (current) use of inhaled steroids: Secondary | ICD-10-CM | POA: Insufficient documentation

## 2014-10-15 DIAGNOSIS — I10 Essential (primary) hypertension: Secondary | ICD-10-CM | POA: Diagnosis not present

## 2014-10-15 DIAGNOSIS — Z79899 Other long term (current) drug therapy: Secondary | ICD-10-CM | POA: Insufficient documentation

## 2014-10-15 DIAGNOSIS — Z792 Long term (current) use of antibiotics: Secondary | ICD-10-CM | POA: Insufficient documentation

## 2014-10-15 DIAGNOSIS — M79642 Pain in left hand: Secondary | ICD-10-CM | POA: Diagnosis present

## 2014-10-15 DIAGNOSIS — G43909 Migraine, unspecified, not intractable, without status migrainosus: Secondary | ICD-10-CM | POA: Diagnosis not present

## 2014-10-15 DIAGNOSIS — Z87891 Personal history of nicotine dependence: Secondary | ICD-10-CM | POA: Diagnosis not present

## 2014-10-15 NOTE — Telephone Encounter (Signed)
Stated he cut his finger a week or so ago at work and it was starting to heal up but now its very swollen and draining pus. Advised urgent care eval

## 2014-10-15 NOTE — ED Notes (Signed)
Pt reports he cut L thumb 5 days and now it has become red and inflamed. Denies fevers/chills. No drainage noted.

## 2014-10-16 ENCOUNTER — Emergency Department (HOSPITAL_COMMUNITY): Payer: Managed Care, Other (non HMO)

## 2014-10-16 ENCOUNTER — Encounter (HOSPITAL_COMMUNITY): Payer: Self-pay | Admitting: Emergency Medicine

## 2014-10-16 ENCOUNTER — Emergency Department (HOSPITAL_COMMUNITY)
Admission: EM | Admit: 2014-10-16 | Discharge: 2014-10-16 | Disposition: A | Discharge status: Home / Self Care | Payer: Managed Care, Other (non HMO) | Attending: Emergency Medicine | Admitting: Emergency Medicine

## 2014-10-16 DIAGNOSIS — M7989 Other specified soft tissue disorders: Secondary | ICD-10-CM

## 2014-10-16 DIAGNOSIS — L03012 Cellulitis of left finger: Secondary | ICD-10-CM

## 2014-10-16 MED ORDER — HYDROCODONE-ACETAMINOPHEN 5-325 MG PO TABS: 1.0000 | ORAL_TABLET | Freq: Four times a day (QID) | ORAL | Status: DC | PRN

## 2014-10-16 MED ORDER — CEPHALEXIN 500 MG PO CAPS: 500.0000 mg | ORAL_CAPSULE | Freq: Four times a day (QID) | ORAL | Status: DC

## 2014-10-16 MED ORDER — AMOXICILLIN-POT CLAVULANATE 875-125 MG PO TABS
1.0000 | ORAL_TABLET | Freq: Two times a day (BID) | ORAL | Status: DC
Start: 2014-10-16 — End: 2014-10-16

## 2014-10-16 MED ORDER — SULFAMETHOXAZOLE-TRIMETHOPRIM 800-160 MG PO TABS: 1.0000 | ORAL_TABLET | Freq: Two times a day (BID) | ORAL | Status: AC

## 2014-10-16 NOTE — ED Provider Notes (Signed)
Medical screening examination/treatment/procedure(s) were conducted as a shared visit with non-physician practitioner(s) and myself.  I personally evaluated the patient during the encounter.   EKG Interpretation None      Patient presents with redness and swelling of the right thumb. Patient sustained injury to the thumb approximate 5 days ago. He is noted swelling and pain increasing over the last 2 days. On physical exam, he has a small laceration just proximal to the nailbed which is well healing. There is swelling and tenderness along the cuticle and just proximal to the nailbed suspicious for paronychia. Patient is able to flex and extend his thumb without difficulty. No evidence of deep space and infection or tendinitis. Paronychia lanced at the bedside with purulent drainage noted by PA.  Merryl Hacker, MD 10/16/14 [phone]

## 2014-10-16 NOTE — ED Provider Notes (Signed)
CSN: [id number]     Arrival date & time 10/15/14  2348 History   First MD Initiated Contact with Patient 10/16/14 0028     Chief Complaint  Patient presents with  . Hand Pain     (Consider location/radiation/quality/duration/timing/severity/associated sxs/prior Treatment) The history is provided by the patient. No language interpreter was used.  Bobby Delacruz is a 54 year old male with past medical history of enlarged heart, migraine, hypertension, kidney cancer presenting to the ED with swelling, redness and pain to the left thumb that is been ongoing for the past 2 days. Patient reported that on Sunday he cut his left thumb, near the cuticle using a deli slicer. Reported that he is up to date with his tetanus shot-reported he has gotten his tetanus within the past 5 years. Reported that when he cut his thumb there was bleeding. Reported that he cleaned warm water, soap, peroxide. Reported that over the past 2 days he has noticed redness, swelling, warmth upon palpation to the left thumb with decreased range of motion secondary to pain. Denied history of diabetes. Denied fever, red streaks, loss of sensation, numbness, tingling, wrist pain, falls, injury. PCP Dr. Moshe Cipro  Past Medical History  Diagnosis Date  . Enlarged heart   . Migraine   . Hypertension 2009  . Cancer   . Kidney cancer, primary, with metastasis from kidney to other site 2011, left    partial nephrectomy , cured   Past Surgical History  Procedure Laterality Date  . Laparoscopic nephrectomy Left 2011    [facility]    Family History  Problem Relation Age of Onset  . Hypertension Mother   . Hypertension Father   . Hypertension Sister    History  Substance Use Topics  . Smoking status: Former Smoker    Quit date: 09/03/2002  . Smokeless tobacco: Not on file  . Alcohol Use: Yes     Comment: socially    Review of Systems  Constitutional: Negative for fever and chills.  Musculoskeletal: Positive for arthralgias  (left thumb).  Skin: Positive for color change and wound.  Neurological: Negative for weakness and numbness.      Allergies  Review of patient's allergies indicates no known allergies.  Home Medications   Prior to Admission medications   Medication Sig Start Date End Date Taking? Authorizing Provider  amLODipine (NORVASC) 5 MG tablet TAKE 1 TABLET BY MOUTH EVERY DAY 08/31/14   Fayrene Helper, MD  cephALEXin (KEFLEX) 500 MG capsule Take 1 capsule (500 mg total) by mouth 4 (four) times daily. 10/16/14   Cleavon Goldman, PA-C  doxycycline (VIBRA-TABS) 100 MG tablet Take 1 tablet (100 mg total) by mouth 2 (two) times daily. 07/15/14   Fayrene Helper, MD  fish oil-omega-3 fatty acids 1000 MG capsule Take 1 g by mouth daily.    Historical Provider, MD  fluticasone (FLONASE) 50 MCG/ACT nasal spray Place 2 sprays into both nostrils daily. 10/22/13   Fayrene Helper, MD  HYDROcodone-acetaminophen (NORCO/VICODIN) 5-325 MG per tablet Take 1 tablet by mouth every 6 (six) hours as needed. 10/16/14   Galadriel Shroff, PA-C  lisinopril-hydrochlorothiazide (PRINZIDE,ZESTORETIC) 20-12.5 MG per tablet TAKE 1 TABLET BY MOUTH EVERY DAY 07/29/14   Fayrene Helper, MD  Multiple Vitamin (MULTIVITAMIN WITH MINERALS) TABS Take 1 tablet by mouth daily.    Historical Provider, MD  sulfamethoxazole-trimethoprim (BACTRIM DS,SEPTRA DS) 800-160 MG per tablet Take 1 tablet by mouth 2 (two) times daily. 10/16/14 10/23/14  Robbin Escher, PA-C  temazepam (  RESTORIL) 7.5 MG capsule Take 1 capsule (7.5 mg total) by mouth at bedtime as needed for sleep. 07/15/14   Fayrene Helper, MD  vitamin C (ASCORBIC ACID) 500 MG tablet Take 500 mg by mouth daily.    Historical Provider, MD   BP 131/99 mmHg  Pulse 72  Temp(Src) 97.8 F (36.6 C) (Oral)  Resp 16  Ht 5\' 8"  (1.727 m)  Wt 191 lb 4.8 oz (86.773 kg)  BMI 29.09 kg/m2  SpO2 100% Physical Exam  Constitutional: He is oriented to person, place, and time. He appears  well-developed and well-nourished. No distress.  HENT:  Head: Normocephalic and atraumatic.  Eyes: Conjunctivae and EOM are normal. Right eye exhibits no discharge. Left eye exhibits no discharge.  Neck: Normal range of motion. Neck supple.  Cardiovascular: Normal rate, regular rhythm and normal heart sounds.  Exam reveals no friction rub.   No murmur heard. Pulses:      Radial pulses are 2+ on the right side, and 2+ on the left side.  Cap refill less than 3 seconds  Pulmonary/Chest: Effort normal and breath sounds normal. No respiratory distress. He has no wheezes. He has no rales.  Musculoskeletal: Normal range of motion. He exhibits tenderness.  Patient is able to produce a fist  Neurological: He is alert and oriented to person, place, and time. No cranial nerve deficit. He exhibits normal muscle tone. Coordination normal.  Equal grip strength Strength 5+/5+ to upper extremities bilaterally Sensation intact  Skin: Skin is warm. He is not diaphoretic. There is erythema.  Swelling identified to the left thumb with erythema and warmth upon palpation. Most swelling surrounding the nailbed. Decreased range of motion identified to the PIP secondary to pain. Full range of motion to the MCP joint of the left thumb. Negative active drainage or bleeding noted. Healed over a laceration noted to the cuticle. Negative red streaks.  Nursing note and vitals reviewed.   ED Course  Procedures (including critical care time)  INCISION AND DRAINAGE Performed by: Jamse Mead Consent: Verbal consent obtained. Risks and benefits: risks, benefits and alternatives were discussed Type: Paronychia  Body area: Left thumb Incision was made with a scalpel. Complexity: complex Drainage: purulent Drainage amount: Moderate  Patient tolerance: Patient tolerated the procedure well with no immediate complications.  Labs Review Labs Reviewed  WOUND CULTURE    Imaging Review Dg Finger Thumb  Left  10/16/2014   CLINICAL DATA:  Cut thumb 6 days ago, with left thumb erythema and swelling. Initial encounter.  EXAM: LEFT THUMB 2+V  COMPARISON:  None.  FINDINGS: The known soft tissue laceration is not well characterized on radiograph. Visualized joint spaces are preserved. There is no evidence of fracture or dislocation. There is no evidence of osseous erosion. No radiopaque foreign bodies are seen.  IMPRESSION: No evidence of fracture or dislocation. No radiopaque foreign bodies seen.   Electronically Signed   By: Garald Balding M.D.   On: 10/16/2014 02:22     EKG Interpretation None       3:04 AM Patient seen and assessed by attending physician, Dr. Dina Rich. Suspicion to be paronychia recommended drainage to be performed and patient to be discharged with antibiotics and hand referral.  MDM   Final diagnoses:  Swelling of finger, left  Paronychia, left    Medications - No data to display  Filed Vitals:   10/16/14 0000 10/16/14 0325  BP: 122/78 131/99  Pulse: 82 72  Temp: 97.9 F (36.6 C) 97.8 F (  36.6 C)  TempSrc: Oral Oral  Resp: 18 16  Height: 5\' 8"  (1.727 m)   Weight: 191 lb 4.8 oz (86.773 kg)   SpO2: 95% 100%   Patient presenting to the ENT with left thumb pain has been ongoing for the past 2 days. Healed over area of laceration identified to left cuticle, ventral aspect of the left hand-as healed over. Swelling, erythema, warmth upon palpation and tenderness upon palpation to the upper portion of the thumb circumferentially. Negative active drainage or bleeding noted. Decreased range of motion to the PIP of the left thumb secondary to pain. Cap refill less than 3 seconds. Pulses palpable and strong. Sensation intact. Equal grip strength. Plain film of left thumb no evidence of fracture dislocation-no radiopaque foreign bodies seen. Patient presenting to the ED with what appears to be a paronychia the left thumb. Patient seen and assessed by attending physician, Dr.  Dina Rich, recommended incision and drainage to be performed - doubted tenosynovitis. Incision and drainage performed with positive purulent drainage-wound culture obtained and pending. Patient tolerated procedure well. Patient stable, afebrile. Patient not septic appearing. Negative findings of ischemia. Negative finding of gangrenous infection. Doubt tenosynovitis. Discharged patient. Discharged patient with antibiotics. Discussed with patient to rest and stay hydrated. Discussed with patient to perform warm soaks. Referred patient to PCP and hand specialist. Discussed with patient to closely monitor symptoms and if symptoms are to worsen or change to report back to the ED - strict return instructions given.  Patient agreed to plan of care, understood, all questions answered.   Jamse Mead, PA-C 10/16/14 [id number]  Merryl Hacker, MD 10/16/14 2325

## 2014-10-16 NOTE — ED Notes (Signed)
Pt st's he cut left thumb 6 days ago.  Lac. Has healed but left thumb is swollen and painful to touch

## 2014-10-16 NOTE — Discharge Instructions (Signed)
Please call your doctor for a followup appointment within 24-48 hours. When you talk to your doctor please let them know that you were seen in the emergency department and have them acquire all of your records so that they can discuss the findings with you and formulate a treatment plan to fully care for your new and ongoing problems. Please follow-up with your primary care provider Please call and set-up an appointment with hand specialist, Dr. Apolonio Schneiders Please take antibiotics as prescribed and on a full stomach Please apply warm soaks at least 4-5 times per day Please take medications as prescribed - while on pain medications there is to be no drinking alcohol, driving, operating any heavy machinery. If extra please dispose in a proper manner. Please do not take any extra Tylenol with this medication for this can lead to Tylenol overdose and liver issues.  Please continue to monitor symptoms closely and if symptoms are to worsen or change (fever greater than 101, chills, sweating, nausea, vomiting, chest pain, shortness of breathe, difficulty breathing, weakness, numbness, tingling, worsening or changes to pain pattern, red streaks running down the finger, increased swelling) please report back to the Emergency Department immediately.   Paronychia Paronychia is an inflammatory reaction involving the folds of the skin surrounding the fingernail. This is commonly caused by an infection in the skin around a nail. The most common cause of paronychia is frequent wetting of the hands (as seen with bartenders, food servers, nurses or others who wet their hands). This makes the skin around the fingernail susceptible to infection by bacteria (germs) or fungus. Other predisposing factors are:  Aggressive manicuring.  Nail biting.  Thumb sucking. The most common cause is a staphylococcal (a type of germ) infection, or a fungal (Candida) infection. When caused by a germ, it usually comes on suddenly with  redness, swelling, pus and is often painful. It may get under the nail and form an abscess (collection of pus), or form an abscess around the nail. If the nail itself is infected with a fungus, the treatment is usually prolonged and may require oral medicine for up to one year. Your caregiver will determine the length of time treatment is required. The paronychia caused by bacteria (germs) may largely be avoided by not pulling on hangnails or picking at cuticles. When the infection occurs at the tips of the finger it is called felon. When the cause of paronychia is from the herpes simplex virus (HSV) it is called herpetic whitlow. TREATMENT  When an abscess is present treatment is often incision and drainage. This means that the abscess must be cut open so the pus can get out. When this is done, the following home care instructions should be followed. HOME CARE INSTRUCTIONS   It is important to keep the affected fingers very dry. Rubber or plastic gloves over cotton gloves should be used whenever the hand must be placed in water.  Keep wound clean, dry and dressed as suggested by your caregiver between warm soaks or warm compresses.  Soak in warm water for fifteen to twenty minutes three to four times per day for bacterial infections. Fungal infections are very difficult to treat, so often require treatment for long periods of time.  For bacterial (germ) infections take antibiotics (medicine which kill germs) as directed and finish the prescription, even if the problem appears to be solved before the medicine is gone.  Only take over-the-counter or prescription medicines for pain, discomfort, or fever as directed by your caregiver.  SEEK IMMEDIATE MEDICAL CARE IF:  You have redness, swelling, or increasing pain in the wound.  You notice pus coming from the wound.  You have a fever.  You notice a bad smell coming from the wound or dressing. Document Released: 01/23/2001 Document Revised:  10/22/2011 Document Reviewed: 09/24/2008 [facility] Patient Information 2015 [place], [place]. This information is not intended to replace advice given to you by your health care provider. Make sure you discuss any questions you have with your health care provider.

## 2014-10-18 ENCOUNTER — Telehealth: Payer: Self-pay

## 2014-10-18 DIAGNOSIS — S61012A Laceration without foreign body of left thumb without damage to nail, initial encounter: Secondary | ICD-10-CM

## 2014-10-18 NOTE — Telephone Encounter (Signed)
Patient developed an infection in a cut to his left thumb that happened 2 weeks ago at work. Was treated in ER (lanced and antibiotics given) and needs a referral to hand specialist. Ok to refer?

## 2014-10-19 ENCOUNTER — Telehealth: Payer: Self-pay | Admitting: Family Medicine

## 2014-10-19 NOTE — Telephone Encounter (Signed)
Referral entered  

## 2014-10-19 NOTE — Telephone Encounter (Signed)
Refer for same please. I will sign. Thank you. IF REPORTING HAND INFECTION NEEDS ASAP APPT, AND should RETURN TO ED FOR RE EVAL

## 2014-10-19 NOTE — Addendum Note (Signed)
Addended by: Eual Fines on: 10/19/2014 09:35 AM   Modules accepted: Orders

## 2014-10-19 NOTE — Telephone Encounter (Signed)
Great, noted

## 2014-10-20 LAB — WOUND CULTURE: Special Requests: NORMAL

## 2014-10-21 ENCOUNTER — Telehealth (HOSPITAL_BASED_OUTPATIENT_CLINIC_OR_DEPARTMENT_OTHER): Payer: Self-pay | Admitting: Emergency Medicine

## 2014-10-21 NOTE — Telephone Encounter (Signed)
Post ED Visit - Positive Culture Follow-up  Culture report reviewed by antimicrobial stewardship pharmacist: []  Wes Dulaney, Pharm.D., BCPS [x]  Heide Guile, Pharm.D., BCPS []  Alycia Rossetti, Pharm.D., BCPS []  [place], [place].D., BCPS, AAHIVP []  Legrand Como, Pharm.D., BCPS, AAHIVP []  Isac Sarna, Pharm.D., BCPS  Positive wound culture moderate E. coli Treated with augmentin,bactrim, cephalexin, organism sensitive to the same and no further patient follow-up is required at this time.  Hazle Nordmann 10/21/2014, 9:42 AM

## 2014-11-04 ENCOUNTER — Other Ambulatory Visit: Payer: Self-pay | Admitting: Family Medicine

## 2014-12-03 ENCOUNTER — Encounter: Payer: Self-pay | Admitting: Family Medicine

## 2014-12-03 ENCOUNTER — Ambulatory Visit: Payer: Managed Care, Other (non HMO) | Admitting: Family Medicine

## 2014-12-20 ENCOUNTER — Other Ambulatory Visit: Payer: Self-pay | Admitting: Family Medicine

## 2014-12-20 ENCOUNTER — Ambulatory Visit (INDEPENDENT_AMBULATORY_CARE_PROVIDER_SITE_OTHER): Payer: Managed Care, Other (non HMO) | Admitting: Family Medicine

## 2014-12-20 VITALS — BP 122/80 | HR 78 | Resp 18 | Ht 68.0 in | Wt 193.0 lb

## 2014-12-20 DIAGNOSIS — N3000 Acute cystitis without hematuria: Secondary | ICD-10-CM | POA: Diagnosis not present

## 2014-12-20 DIAGNOSIS — E663 Overweight: Secondary | ICD-10-CM

## 2014-12-20 DIAGNOSIS — M5410 Radiculopathy, site unspecified: Secondary | ICD-10-CM

## 2014-12-20 DIAGNOSIS — Z1211 Encounter for screening for malignant neoplasm of colon: Secondary | ICD-10-CM | POA: Diagnosis not present

## 2014-12-20 DIAGNOSIS — E8881 Metabolic syndrome: Secondary | ICD-10-CM

## 2014-12-20 DIAGNOSIS — E785 Hyperlipidemia, unspecified: Secondary | ICD-10-CM

## 2014-12-20 DIAGNOSIS — F419 Anxiety disorder, unspecified: Secondary | ICD-10-CM

## 2014-12-20 DIAGNOSIS — R7303 Prediabetes: Secondary | ICD-10-CM

## 2014-12-20 DIAGNOSIS — I10 Essential (primary) hypertension: Secondary | ICD-10-CM

## 2014-12-20 DIAGNOSIS — F5105 Insomnia due to other mental disorder: Secondary | ICD-10-CM

## 2014-12-20 DIAGNOSIS — R7309 Other abnormal glucose: Secondary | ICD-10-CM

## 2014-12-20 DIAGNOSIS — M545 Low back pain, unspecified: Secondary | ICD-10-CM | POA: Insufficient documentation

## 2014-12-20 DIAGNOSIS — M79605 Pain in left leg: Secondary | ICD-10-CM | POA: Insufficient documentation

## 2014-12-20 LAB — POCT URINALYSIS DIPSTICK
BILIRUBIN UA: NEGATIVE
Glucose, UA: NEGATIVE
Ketones, UA: NEGATIVE
LEUKOCYTES UA: NEGATIVE
Nitrite, UA: NEGATIVE
Protein, UA: NEGATIVE
RBC UA: NEGATIVE
Spec Grav, UA: 1.02
UROBILINOGEN UA: 0.2
pH, UA: 5.5

## 2014-12-20 LAB — POC HEMOCCULT BLD/STL (OFFICE/1-CARD/DIAGNOSTIC): Fecal Occult Blood, POC: NEGATIVE

## 2014-12-20 MED ORDER — GABAPENTIN 300 MG PO CAPS: 300.0000 mg | ORAL_CAPSULE | Freq: Every day | ORAL | Status: DC

## 2014-12-20 MED ORDER — IBUPROFEN 800 MG PO TABS: 800.0000 mg | ORAL_TABLET | Freq: Three times a day (TID) | ORAL | Status: DC

## 2014-12-20 MED ORDER — KETOROLAC TROMETHAMINE 60 MG/2ML IM SOLN
60.0000 mg | Freq: Once | INTRAMUSCULAR | Status: AC
  Administered 2014-12-20: 60 mg via INTRAMUSCULAR

## 2014-12-20 MED ORDER — PREDNISONE 5 MG PO TABS: ORAL_TABLET | ORAL | Status: DC

## 2014-12-20 MED ORDER — LISINOPRIL 20 MG PO TABS: 20.0000 mg | ORAL_TABLET | Freq: Every day | ORAL | Status: DC

## 2014-12-20 MED ORDER — METHYLPREDNISOLONE ACETATE 80 MG/ML IJ SUSP
80.0000 mg | Freq: Once | INTRAMUSCULAR | Status: AC
  Administered 2014-12-20: 80 mg via INTRAMUSCULAR

## 2014-12-20 MED ORDER — RANITIDINE HCL 300 MG PO TABS: 300.0000 mg | ORAL_TABLET | Freq: Every day | ORAL | Status: DC

## 2014-12-20 NOTE — Patient Instructions (Signed)
F/u in 4.5 month, call if you need me before  NURSE BP check in 4 weeks  Injections in office, toradol and depo medrol today followed by short course of antinflammatories, ibuprofen and prednisone, also medication , zantac to prevent stomach irritation New for chronic low back pain is gabapentin one at bedtime which will also likely help with sleep.  Urine is being checked today based on complaint of frequency/urgency, NO ABNORMALITY IN URINE  Pls sign for urology report from [place].  Change in one of your blood pressure meds from lisinopril /HCTZ to plain lisinopril, may help urine symptoms  Prostate exam today is normal, and there is no hidden blood in your stool, good results!  NEW is aspirin 81 mg coated for heart protection  Congrats on weight loss through healthy lifestyle, EXCEPT you NEED MORE SLEEP  Thanks for choosing [facility], we consider it a privelige to serve you.

## 2014-12-21 DIAGNOSIS — Z1211 Encounter for screening for malignant neoplasm of colon: Secondary | ICD-10-CM | POA: Insufficient documentation

## 2014-12-21 LAB — CBC
HEMATOCRIT: 40.8 % (ref 39.0–52.0)
Hemoglobin: 13.2 g/dL (ref 13.0–17.0)
MCH: 25.5 pg — ABNORMAL LOW (ref 26.0–34.0)
MCHC: 32.4 g/dL (ref 30.0–36.0)
MCV: 78.8 fL (ref 78.0–100.0)
MPV: 10.5 fL (ref 9.4–12.4)
Platelets: 256 10*3/uL (ref 150–400)
RBC: 5.18 MIL/uL (ref 4.22–5.81)
RDW: 15.9 % — AB (ref 11.5–15.5)
WBC: 5.7 10*3/uL (ref 4.0–10.5)

## 2014-12-21 LAB — BASIC METABOLIC PANEL
BUN: 23 mg/dL (ref 6–23)
CO2: 24 meq/L (ref 19–32)
Calcium: 9.4 mg/dL (ref 8.4–10.5)
Chloride: 102 mEq/L (ref 96–112)
Creat: 1.38 mg/dL — ABNORMAL HIGH (ref 0.50–1.35)
GLUCOSE: 80 mg/dL (ref 70–99)
POTASSIUM: 4.1 meq/L (ref 3.5–5.3)
Sodium: 138 mEq/L (ref 135–145)

## 2014-12-21 LAB — LIPID PANEL
CHOL/HDL RATIO: 3.5 ratio
Cholesterol: 160 mg/dL (ref 0–200)
HDL: 46 mg/dL (ref >=40)
LDL CALC: 99 mg/dL (ref 0–99)
TRIGLYCERIDES: 77 mg/dL (ref <150)
VLDL: 15 mg/dL (ref 0–40)

## 2014-12-21 LAB — HEMOGLOBIN A1C
Hgb A1c MFr Bld: 5.9 % — ABNORMAL HIGH (ref <5.7)
Mean Plasma Glucose: 123 mg/dL — ABNORMAL HIGH (ref <117)

## 2014-12-21 LAB — TSH: TSH: 0.974 u[IU]/mL (ref 0.350–4.500)

## 2014-12-21 NOTE — Assessment & Plan Note (Signed)
The increased risk of cardiovascular disease associated with this diagnosis, and the need to consistently work on lifestyle to change this is discussed. Following  a  heart healthy diet ,commitment to 30 minutes of exercise at least 5 days per week, as well as control of blood sugar and cholesterol , and achieving a healthy weight are all the areas to be addressed . Updated lab needed .  

## 2014-12-21 NOTE — Assessment & Plan Note (Signed)
Improved Patient re-educated about  the importance of commitment to a  minimum of 150 minutes of exercise per week.  The importance of healthy food choices with portion control discussed. Encouraged to start a food diary, count calories and to consider  joining a support group. Sample diet sheets offered. Goals set by the patient for the next several months.   Weight /BMI 12/20/2014 10/16/2014 07/15/2014  WEIGHT 193 lb 0.6 oz 191 lb 4.8 oz 200 lb  HEIGHT 5\' 8"  5\' 8"  5\' 8"   BMI 29.36 kg/m2 29.09 kg/m2 30.42 kg/m2    Current exercise per week 150 minutes.

## 2014-12-21 NOTE — Assessment & Plan Note (Signed)
Sleep hygiene reviewed and written information offered also. Pt essentially has a 5 hr window to sleep between his 2 jobs currently, needs to change this, no change in medication

## 2014-12-21 NOTE — Assessment & Plan Note (Signed)
Hyperlipidemia:Low fat diet discussed and encouraged.   Lipid Panel  Lab Results  Component Value Date   CHOL 177 09/03/2013   HDL 40 09/03/2013   LDLCALC 118* 09/03/2013   TRIG 96 09/03/2013   CHOLHDL 4.4 09/03/2013      Updated lab needed at/ before next visit.

## 2014-12-21 NOTE — Assessment & Plan Note (Addendum)
Updated lab needed  Patient educated about the importance of limiting  Carbohydrate intake , the need to commit to daily physical activity for a minimum of 30 minutes , and to commit weight loss. The fact that changes in all these areas will reduce or eliminate all together the development of diabetes is stressed.   Diabetic Labs Latest Ref Rng 07/15/2014 09/03/2013 02/20/2012 06/12/2010  HbA1c <5.7 % 6.0(H) 6.1(H) - -  Chol 0 - 200 mg/dL - 177 - -  HDL >39 mg/dL - 40 - -  Calc LDL 0 - 99 mg/dL - 118(H) - -  Triglycerides <150 mg/dL - 96 - -  Creatinine 0.50 - 1.35 mg/dL 1.08 1.12 1.07 1.10   BP/Weight 12/20/2014 10/16/2014 07/15/2014 12/11/2013 10/22/2013 09/03/2013 12/19/3265  Systolic BP 124 580 998 338 250 539 767  Diastolic BP 80 99 82 88 90 96 80  Wt. (Lbs) 193.04 191.3 200 211 214 214.12 -  BMI 29.36 29.09 30.42 32.09 32.55 32.56 -   No flowsheet data found.  '

## 2014-12-21 NOTE — Assessment & Plan Note (Signed)
1 week h/o acute flare Uncontrolled.Toradol and depo medrol administered IM in the office , to be followed by a short course of oral prednisone and NSAIDS. Start gabapentin 300 mg at bedtime also

## 2014-12-21 NOTE — Assessment & Plan Note (Signed)
Controlled however reports urinary frequency, d/c diuretic component, continue DASH Nurse BP check in 4 weeks

## 2014-12-21 NOTE — Progress Notes (Signed)
Subjective:    Patient ID: Bobby Delacruz, male    DOB: [birth year], 54 y.o.   MRN: [id number]  HPI The PT is here for follow up and re-evaluation of chronic medical conditions, medication management and review of any available recent lab and radiology data.  Preventive health is updated, specifically  Cancer screening and Immunization.   Did not go to pain specialist as requested re LBP. The PT denies any adverse reactions to current medications since the last visit.  1 week h/o LBP radiates to buttocks, aggravated by prolonged standing, works from 7 am to 11 pm, 2 jobs, denies lower ext weakness, numbness or incontinence. C/o urinary frequency worsening in past 2 month       Review of Systems See HPI  Denies recent fever or chills. Denies sinus pressure, nasal congestion, ear pain or sore throat. Denies chest congestion, productive cough or wheezing. Denies chest pains, palpitations and leg swelling Denies abdominal pain, nausea, vomiting,diarrhea or constipation.   Denies dysuria, frequency, hesitancy or incontinence Denies headaches, seizures, numbness, or tingling. Denies depression, anxiety c/o inadequate  Sleep, however gets home with a 5 hr sleep window every night Denies skin break down or rash.         Objective:   Physical Exam BP 122/80 mmHg  Pulse 78  Resp 18  Ht 5\' 8"  (1.727 m)  Wt 193 lb 0.6 oz (87.562 kg)  BMI 29.36 kg/m2  SpO2 99%  Patient alert and oriented and in no cardiopulmonary distress.  HEENT: No facial asymmetry, EOMI,   oropharynx pink and moist.  Neck supple no JVD, no mass.  Chest: Clear to auscultation bilaterally.  CVS: S1, S2 no murmurs, no S3.Regular rate.  ABD: Soft non tender. No organomegaly or mass, normal BS Rectal: no mass, prostae not enlarged, smooth, heme negative stool  Ext: No edema  MS: decreased lumbar  ROM spine, adequate in shoulders, hips and knees.  Skin: Intact, no ulcerations or rash noted.  Psych:  Good eye contact, normal affect. Memory intact not anxious or depressed appearing.  CNS: CN 2-12 intact, power,  normal throughout.no focal deficits noted.        Assessment & Plan:  HTN (hypertension) Controlled however reports urinary frequency, d/c diuretic component, continue DASH Nurse BP check in 4 weeks   Prediabetes Updated lab needed  Patient educated about the importance of limiting  Carbohydrate intake , the need to commit to daily physical activity for a minimum of 30 minutes , and to commit weight loss. The fact that changes in all these areas will reduce or eliminate all together the development of diabetes is stressed.   Diabetic Labs Latest Ref Rng 07/15/2014 09/03/2013 02/20/2012 06/12/2010  HbA1c <5.7 % 6.0(H) 6.1(H) - -  Chol 0 - 200 mg/dL - 177 - -  HDL >39 mg/dL - 40 - -  Calc LDL 0 - 99 mg/dL - 118(H) - -  Triglycerides <150 mg/dL - 96 - -  Creatinine 0.50 - 1.35 mg/dL 1.08 1.12 1.07 1.10   BP/Weight 12/20/2014 10/16/2014 07/15/2014 12/11/2013 10/22/2013 09/03/2013 12/28/15  Systolic BP 494 496 759 163 846 659 935  Diastolic BP 80 99 82 88 90 96 80  Wt. (Lbs) 193.04 191.3 200 211 214 214.12 -  BMI 29.36 29.09 30.42 32.09 32.55 32.56 -   No flowsheet data found.  '    Overweight (BMI 25.0-29.9) Improved Patient re-educated about  the importance of commitment to a  minimum of 150 minutes of  exercise per week.  The importance of healthy food choices with portion control discussed. Encouraged to start a food diary, count calories and to consider  joining a support group. Sample diet sheets offered. Goals set by the patient for the next several months.   Weight /BMI 12/20/2014 10/16/2014 07/15/2014  WEIGHT 193 lb 0.6 oz 191 lb 4.8 oz 200 lb  HEIGHT 5\' 8"  5\' 8"  5\' 8"   BMI 29.36 kg/m2 29.09 kg/m2 30.42 kg/m2    Current exercise per week 150 minutes.    Metabolic syndrome X The increased risk of cardiovascular disease associated with this diagnosis, and the  need to consistently work on lifestyle to change this is discussed. Following  a  heart healthy diet ,commitment to 30 minutes of exercise at least 5 days per week, as well as control of blood sugar and cholesterol , and achieving a healthy weight are all the areas to be addressed . Updated lab needed .    Low back pain radiating to both legs 1 week h/o acute flare Uncontrolled.Toradol and depo medrol administered IM in the office , to be followed by a short course of oral prednisone and NSAIDS. Start gabapentin 300 mg at bedtime also   Insomnia secondary to anxiety Sleep hygiene reviewed and written information offered also. Pt essentially has a 5 hr window to sleep between his 2 jobs currently, needs to change this, no change in medication   Dyslipidemia Hyperlipidemia:Low fat diet discussed and encouraged.   Lipid Panel  Lab Results  Component Value Date   CHOL 177 09/03/2013   HDL 40 09/03/2013   LDLCALC 118* 09/03/2013   TRIG 96 09/03/2013   CHOLHDL 4.4 09/03/2013      Updated lab needed at/ before next visit.    Special screening for malignant neoplasms, colon Heme negative stool, no rectal mass palpted

## 2014-12-21 NOTE — Assessment & Plan Note (Addendum)
Heme negative stool, no rectal mass palpated

## 2014-12-22 LAB — CREATININE WITH EST GFR
CREATININE: 1.28 mg/dL (ref 0.50–1.35)
GFR, Est African American: 74 mL/min
GFR, Est Non African American: 64 mL/min

## 2014-12-28 ENCOUNTER — Encounter: Payer: Self-pay | Admitting: Family Medicine

## 2014-12-28 ENCOUNTER — Ambulatory Visit (INDEPENDENT_AMBULATORY_CARE_PROVIDER_SITE_OTHER): Payer: Managed Care, Other (non HMO) | Admitting: Family Medicine

## 2014-12-28 VITALS — BP 142/84 | HR 87 | Resp 16 | Wt 190.0 lb

## 2014-12-28 DIAGNOSIS — M79605 Pain in left leg: Secondary | ICD-10-CM

## 2014-12-28 DIAGNOSIS — I10 Essential (primary) hypertension: Secondary | ICD-10-CM

## 2014-12-28 DIAGNOSIS — M545 Low back pain, unspecified: Secondary | ICD-10-CM

## 2014-12-28 DIAGNOSIS — M5410 Radiculopathy, site unspecified: Secondary | ICD-10-CM | POA: Diagnosis not present

## 2014-12-28 DIAGNOSIS — M79604 Pain in right leg: Secondary | ICD-10-CM

## 2014-12-28 MED ORDER — KETOROLAC TROMETHAMINE 60 MG/2ML IM SOLN
60.0000 mg | Freq: Once | INTRAMUSCULAR | Status: AC
  Administered 2014-12-28: 60 mg via INTRAMUSCULAR

## 2014-12-28 MED ORDER — CYCLOBENZAPRINE HCL 10 MG PO TABS: ORAL_TABLET | ORAL | Status: DC

## 2014-12-28 MED ORDER — PREDNISONE 10 MG (21) PO TBPK: 10.0000 mg | ORAL_TABLET | Freq: Every day | ORAL | Status: DC

## 2014-12-28 MED ORDER — METHYLPREDNISOLONE ACETATE 80 MG/ML IJ SUSP
120.0000 mg | Freq: Once | INTRAMUSCULAR | Status: AC
  Administered 2014-12-28: 120 mg via INTRAMUSCULAR

## 2014-12-28 MED ORDER — HYDROCODONE-ACETAMINOPHEN 5-325 MG PO TABS: ORAL_TABLET | ORAL | Status: DC

## 2014-12-28 MED ORDER — MELOXICAM 15 MG PO TABS: 15.0000 mg | ORAL_TABLET | Freq: Every day | ORAL | Status: DC

## 2014-12-28 NOTE — Assessment & Plan Note (Signed)
Uncontrolled.Toradol and depo medrol administered IM in the office , to be followed by a short course of oral prednisone and NSAIDS. Hydrocodne and flexeril also prescribed

## 2014-12-28 NOTE — Patient Instructions (Signed)
F/u as before  Injecttons today and medication prescribed.    You NEED and MRI of your low back asap

## 2014-12-28 NOTE — Progress Notes (Signed)
   Subjective:    Patient ID: Bobby Delacruz, male    DOB: 1960/12/11, 54 y.o.   MRN: [id number]  HPI 3 day h/o acute LBP radiating to right groin and thigh, inner and outer to knee no known aggravating factor, no lower extremity weakness or numbness, no incontinence, has had similar severe pain years ago, needs re imaging study, however travelling away for his job for 3 months, leaves in the morning Also c/o spasm in low back  Review of Systems See HPI Denies recent fever or chills. Denies sinus pressure, nasal congestion, ear pain or sore throat. Denies chest congestion, productive cough or wheezing. Denies chest pains, palpitations and leg swelling  Denies depression, anxiety or insomnia. Denies skin break down or rash.        Objective:   Physical Exam  BP 142/84 mmHg  Pulse 87  Resp 16  Wt 190 lb (86.183 kg)  SpO2 99% Patient alert and oriented and in no cardiopulmonary distress.Pt in pain  HEENT: No facial asymmetry, EOMI,   Chest: Clear to auscultation bilaterally.  CVS: S1, S2 no murmurs, no S3.Regular rate.  ABD: Soft non tender.   Ext: No edema  MS: Decreased  ROM lumbar spine, adequate in shoulders, hips and knees.   anxious or depressed appearing.  CNS: CN 2-12 intact, power,  normal throughout.no focal deficits noted.       Assessment & Plan:  .Low back pain radiating to both legs Uncontrolled.Toradol and depo medrol administered IM in the office , to be followed by a short course of oral prednisone and NSAIDS. Hydrocodne and flexeril also prescribed   HTN (hypertension) Elevated at this visit, however , pt in a significant amount of pain, no change in management at this time

## 2014-12-28 NOTE — Assessment & Plan Note (Signed)
Elevated at this visit, however , pt in a significant amount of pain, no change in management at this time

## 2015-01-17 ENCOUNTER — Other Ambulatory Visit: Payer: Self-pay

## 2015-01-17 DIAGNOSIS — I10 Essential (primary) hypertension: Secondary | ICD-10-CM

## 2015-01-17 DIAGNOSIS — M545 Low back pain, unspecified: Secondary | ICD-10-CM

## 2015-01-17 DIAGNOSIS — F419 Anxiety disorder, unspecified: Secondary | ICD-10-CM

## 2015-01-17 DIAGNOSIS — M79605 Pain in left leg: Secondary | ICD-10-CM

## 2015-01-17 DIAGNOSIS — F5105 Insomnia due to other mental disorder: Secondary | ICD-10-CM

## 2015-01-17 MED ORDER — AMLODIPINE BESYLATE 5 MG PO TABS: 5.0000 mg | ORAL_TABLET | Freq: Every day | ORAL | Status: DC

## 2015-01-17 MED ORDER — TEMAZEPAM 7.5 MG PO CAPS: 7.5000 mg | ORAL_CAPSULE | Freq: Every evening | ORAL | Status: DC | PRN

## 2015-01-17 MED ORDER — CYCLOBENZAPRINE HCL 10 MG PO TABS: ORAL_TABLET | ORAL | Status: DC

## 2015-01-17 MED ORDER — GABAPENTIN 300 MG PO CAPS: 300.0000 mg | ORAL_CAPSULE | Freq: Every day | ORAL | Status: DC

## 2015-01-17 MED ORDER — IBUPROFEN 800 MG PO TABS: 800.0000 mg | ORAL_TABLET | Freq: Three times a day (TID) | ORAL | Status: DC

## 2015-01-17 MED ORDER — RANITIDINE HCL 300 MG PO TABS: 300.0000 mg | ORAL_TABLET | Freq: Every day | ORAL | Status: DC

## 2015-01-17 MED ORDER — LISINOPRIL 20 MG PO TABS: 20.0000 mg | ORAL_TABLET | Freq: Every day | ORAL | Status: DC

## 2015-01-17 MED ORDER — MELOXICAM 15 MG PO TABS: 15.0000 mg | ORAL_TABLET | Freq: Every day | ORAL | Status: DC

## 2015-01-23 ENCOUNTER — Encounter: Payer: Self-pay | Admitting: Family Medicine

## 2015-01-23 DIAGNOSIS — N3000 Acute cystitis without hematuria: Secondary | ICD-10-CM | POA: Insufficient documentation

## 2015-01-23 NOTE — Assessment & Plan Note (Signed)
Normal urinalysis, prostate normal in size on exam, no nodules palpated, smooth

## 2015-02-17 ENCOUNTER — Other Ambulatory Visit: Payer: Self-pay | Admitting: Family Medicine

## 2015-02-24 ENCOUNTER — Telehealth: Payer: Self-pay | Admitting: *Deleted

## 2015-02-24 DIAGNOSIS — M545 Low back pain, unspecified: Secondary | ICD-10-CM

## 2015-02-24 DIAGNOSIS — M79604 Pain in right leg: Secondary | ICD-10-CM

## 2015-02-24 DIAGNOSIS — M79605 Pain in left leg: Secondary | ICD-10-CM

## 2015-02-24 DIAGNOSIS — J309 Allergic rhinitis, unspecified: Secondary | ICD-10-CM

## 2015-02-24 NOTE — Telephone Encounter (Signed)
Pt called requesting Brandi to call him back at [phone]

## 2015-02-25 MED ORDER — FLUTICASONE PROPIONATE 50 MCG/ACT NA SUSP: 2.0000 | Freq: Every day | NASAL | Status: DC

## 2015-02-25 MED ORDER — IBUPROFEN 800 MG PO TABS: 800.0000 mg | ORAL_TABLET | Freq: Three times a day (TID) | ORAL | Status: DC

## 2015-02-25 NOTE — Telephone Encounter (Signed)
Patient is still hurting in his back and is wanting to know if he can have a refill on the hydrocodone. Please advise

## 2015-02-25 NOTE — Telephone Encounter (Signed)
Patient aware.

## 2015-02-25 NOTE — Telephone Encounter (Signed)
Explain , not an option, this is a medication which requires face to face encounter, he is on no pain contract, will not start one with him living outside of [place] even temporarily, he needs to have evaluation by a Doc where he is living and will likley need imaging etc. Explain I am concerned, but this is a problem that  I am unable to address with him having temporarily re located at this time

## 2015-03-19 ENCOUNTER — Other Ambulatory Visit: Payer: Self-pay | Admitting: Family Medicine

## 2015-03-29 ENCOUNTER — Other Ambulatory Visit: Payer: Self-pay

## 2015-03-29 DIAGNOSIS — M545 Low back pain, unspecified: Secondary | ICD-10-CM

## 2015-03-29 DIAGNOSIS — M79604 Pain in right leg: Secondary | ICD-10-CM

## 2015-03-29 DIAGNOSIS — M79605 Pain in left leg: Secondary | ICD-10-CM

## 2015-03-29 MED ORDER — IBUPROFEN 800 MG PO TABS: 800.0000 mg | ORAL_TABLET | Freq: Three times a day (TID) | ORAL | Status: DC

## 2015-05-02 ENCOUNTER — Ambulatory Visit: Payer: Managed Care, Other (non HMO) | Admitting: Family Medicine

## 2015-05-11 ENCOUNTER — Encounter: Payer: Self-pay | Admitting: *Deleted

## 2015-05-11 ENCOUNTER — Ambulatory Visit: Payer: Managed Care, Other (non HMO) | Admitting: Family Medicine

## 2015-05-20 ENCOUNTER — Other Ambulatory Visit: Payer: Self-pay

## 2015-05-20 DIAGNOSIS — M79605 Pain in left leg: Secondary | ICD-10-CM

## 2015-05-20 DIAGNOSIS — M545 Low back pain, unspecified: Secondary | ICD-10-CM

## 2015-05-20 DIAGNOSIS — M79604 Pain in right leg: Secondary | ICD-10-CM

## 2015-05-20 MED ORDER — CYCLOBENZAPRINE HCL 10 MG PO TABS: 10.0000 mg | ORAL_TABLET | Freq: Two times a day (BID) | ORAL | Status: DC

## 2015-05-20 MED ORDER — IBUPROFEN 800 MG PO TABS: 800.0000 mg | ORAL_TABLET | Freq: Three times a day (TID) | ORAL | Status: DC

## 2015-06-09 ENCOUNTER — Other Ambulatory Visit: Payer: Self-pay | Admitting: Family Medicine

## 2015-06-16 ENCOUNTER — Other Ambulatory Visit: Payer: Self-pay | Admitting: Family Medicine

## 2015-06-27 ENCOUNTER — Ambulatory Visit (INDEPENDENT_AMBULATORY_CARE_PROVIDER_SITE_OTHER): Payer: Managed Care, Other (non HMO) | Admitting: Family Medicine

## 2015-06-27 ENCOUNTER — Encounter: Payer: Self-pay | Admitting: Family Medicine

## 2015-06-27 VITALS — BP 140/90 | HR 97 | Resp 18 | Ht 68.0 in | Wt 206.0 lb

## 2015-06-27 DIAGNOSIS — Z23 Encounter for immunization: Secondary | ICD-10-CM | POA: Diagnosis not present

## 2015-06-27 DIAGNOSIS — R7303 Prediabetes: Secondary | ICD-10-CM

## 2015-06-27 DIAGNOSIS — E669 Obesity, unspecified: Secondary | ICD-10-CM | POA: Diagnosis not present

## 2015-06-27 DIAGNOSIS — M79605 Pain in left leg: Secondary | ICD-10-CM

## 2015-06-27 DIAGNOSIS — I10 Essential (primary) hypertension: Secondary | ICD-10-CM | POA: Diagnosis not present

## 2015-06-27 DIAGNOSIS — M545 Low back pain, unspecified: Secondary | ICD-10-CM

## 2015-06-27 NOTE — Assessment & Plan Note (Signed)
Deteriorated. Patient re-educated about  the importance of commitment to a  minimum of 150 minutes of exercise per week.  The importance of healthy food choices with portion control discussed. Encouraged to start a food diary, count calories and to consider  joining a support group. Sample diet sheets offered. Goals set by the patient for the next several months.   Weight /BMI 06/27/2015 12/28/2014 12/20/2014  WEIGHT 206 lb 190 lb 193 lb 0.6 oz  HEIGHT 5\' 8"  - 5\' 8"   BMI 31.33 kg/m2 28.9 kg/m2 29.36 kg/m2    Current exercise per week 120 minutes.

## 2015-06-27 NOTE — Assessment & Plan Note (Signed)
Patient educated about the importance of limiting  Carbohydrate intake , the need to commit to daily physical activity for a minimum of 30 minutes , and to commit weight loss. The fact that changes in all these areas will reduce or eliminate all together the development of diabetes is stressed.   Diabetic Labs Latest Ref Rng 12/20/2014 12/20/2014 07/15/2014 09/03/2013 02/20/2012  HbA1c <5.7 % - 5.9(H) 6.0(H) 6.1(H) -  Chol 0 - 200 mg/dL - 160 - 177 -  HDL >=40 mg/dL - 46 - 40 -  Calc LDL 0 - 99 mg/dL - 99 - 118(H) -  Triglycerides <150 mg/dL - 77 - 96 -  Creatinine 0.50 - 1.35 mg/dL 1.28 1.38(H) 1.08 1.12 1.07   BP/Weight 06/27/2015 12/28/2014 12/20/2014 10/16/2014 07/15/2014 12/11/2013 [id number]  Systolic BP XX123456 A999333 [id number] A999333 [id number] 0000000 [id number]  Diastolic BP 90 84 80 99 82 88 90  Wt. (Lbs) 206 190 193.04 191.3 200 211 214  BMI 31.33 28.9 29.36 29.09 30.42 32.09 32.55   No flowsheet data found.  Likely deteriorated with13 pound weight gain   Updated lab needed at/ before next visit.

## 2015-06-27 NOTE — Assessment & Plan Note (Addendum)
Uncontrolled, no med change , pt has ahd a 13 pound weight gain since last see DASH diet and commitment to daily physical activity for a minimum of 30 minutes discussed and encouraged, as a part of hypertension management. The importance of attaining a healthy weight is also discussed.  BP/Weight 06/27/2015 12/28/2014 12/20/2014 10/16/2014 07/15/2014 12/11/2013 [id number]  Systolic BP [id number] A999333 [id number] 0000000 [id number]  Diastolic BP 90 84 80 99 82 88 90  Wt. (Lbs) 206 190 193.04 191.3 200 211 214  BMI 31.33 28.9 29.36 29.09 30.42 32.09 32.55

## 2015-06-27 NOTE — Assessment & Plan Note (Signed)
Improved, no recent flare since presentation 6 months ago

## 2015-06-27 NOTE — Patient Instructions (Signed)
F/u in 4.5 month , call if you need me before  Non fast HBa1C, chem 7 and EGFR in 4.5  Month   Please work on good  health habits so that your health will improve. 1. Commitment to daily physical activity for 30 to 60  minutes, if you are able to do this.  2. Commitment to wise food choices. Aim for half of your  food intake to be vegetable and fruit, one quarter starchy foods, and one quarter protein. Try to eat on a regular schedule  3 meals per day, snacking between meals should be limited to vegetables or fruits or small portions of nuts. 64 ounces of water per day is generally recommended, unless you have specific health conditions, like heart failure or kidney failure where you will need to limit fluid intake.  3. Commitment to sufficient and a  good quality of physical and mental rest daily, generally between 6 to 8 hours per day.  WITH PERSISTANCE AND PERSEVERANCE, THE IMPOSSIBLE , BECOMES THE NORM!   Thanks for choosing [facility], we consider it a privelige to serve you.  Flu vaccine today

## 2015-06-27 NOTE — Progress Notes (Signed)
Subjective:    Patient ID: Bobby Delacruz, male    DOB: [birth year], 54 y.o.   MRN: [id number]  HPI   Bobby Delacruz     MRN: [id number]      DOB: Jan 11, 1961   HPI Bobby Delacruz is here for follow up and re-evaluation of chronic medical conditions, medication management and review of any available recent lab and radiology data.  Preventive health is updated, specifically  Cancer screening and Immunization.   Questions or concerns regarding consultations or procedures which the PT has had in the interim are  addressed. The PT denies any adverse reactions to current medications since the last visit.  Concerned about his significant weight gain and does admit to overeatring calories in past several months when he was out of town   ROS Denies recent fever or chills. Denies sinus pressure, nasal congestion, ear pain or sore throat. Denies chest congestion, productive cough or wheezing. Denies chest pains, palpitations and leg swelling Denies abdominal pain, nausea, vomiting,diarrhea or constipation.   Denies dysuria, frequency, hesitancy or incontinence. Denies joint pain, swelling and limitation in mobility. Denies headaches, seizures, numbness, or tingling. Denies depression, anxiety or insomnia. Denies skin break down or rash.   PE  BP 140/90 mmHg  Pulse 97  Resp 18  Ht 5\' 8"  (1.727 m)  Wt 206 lb (93.441 kg)  BMI 31.33 kg/m2  SpO2 98%  Patient alert and oriented and in no cardiopulmonary distress.  HEENT: No facial asymmetry, EOMI,   oropharynx pink and moist.  Neck supple no JVD, no mass.  Chest: Clear to auscultation bilaterally.  CVS: S1, S2 no murmurs, no S3.Regular rate.  ABD: Soft non tender.   Ext: No edema  MS: Adequate ROM spine, shoulders, hips and knees.  Skin: Intact, no ulcerations or rash noted.  Psych: Good eye contact, normal affect. Memory intact not anxious or depressed appearing.  CNS: CN 2-12 intact, power,  normal throughout.no focal  deficits noted.   Assessment & Plan   HTN (hypertension) Uncontrolled, no med change , pt has ahd a 13 pound weight gain since last see DASH diet and commitment to daily physical activity for a minimum of 30 minutes discussed and encouraged, as a part of hypertension management. The importance of attaining a healthy weight is also discussed.  BP/Weight 06/27/2015 12/28/2014 12/20/2014 10/16/2014 07/15/2014 12/11/2013 [id number]  Systolic BP [id number] [id number] 0000000 [id number]  Diastolic BP 90 84 80 99 82 88 90  Wt. (Lbs) 206 190 193.04 191.3 200 211 214  BMI 31.33 28.9 29.36 29.09 30.42 32.09 32.55        Obesity (BMI 30.0-34.9) Deteriorated. Patient re-educated about  the importance of commitment to a  minimum of 150 minutes of exercise per week.  The importance of healthy food choices with portion control discussed. Encouraged to start a food diary, count calories and to consider  joining a support group. Sample diet sheets offered. Goals set by the patient for the next several months.   Weight /BMI 06/27/2015 12/28/2014 12/20/2014  WEIGHT 206 lb 190 lb 193 lb 0.6 oz  HEIGHT 5\' 8"  - 5\' 8"   BMI 31.33 kg/m2 28.9 kg/m2 29.36 kg/m2    Current exercise per week 120 minutes.   Low back pain radiating to both legs Improved, no recent flare since presentation 6 months ago  Prediabetes Patient educated about the importance of limiting  Carbohydrate intake , the need to commit to daily physical activity for a minimum of  30 minutes , and to commit weight loss. The fact that changes in all these areas will reduce or eliminate all together the development of diabetes is stressed.   Diabetic Labs Latest Ref Rng 12/20/2014 12/20/2014 07/15/2014 09/03/2013 02/20/2012  HbA1c <5.7 % - 5.9(H) 6.0(H) 6.1(H) -  Chol 0 - 200 mg/dL - 160 - 177 -  HDL >=40 mg/dL - 46 - 40 -  Calc LDL 0 - 99 mg/dL - 99 - 118(H) -  Triglycerides <150 mg/dL - 77 - 96 -  Creatinine 0.50 - 1.35 mg/dL 1.28 1.38(H) 1.08 1.12 1.07    BP/Weight 06/27/2015 12/28/2014 12/20/2014 10/16/2014 07/15/2014 12/11/2013 [id number]  Systolic BP [id number] A999333 [id number] A999333 123456 0000000 [id number]  Diastolic BP 90 84 80 99 82 88 90  Wt. (Lbs) 206 190 193.04 191.3 200 211 214  BMI 31.33 28.9 29.36 29.09 30.42 32.09 32.55   No flowsheet data found.  Likely deteriorated with13 pound weight gain   Updated lab needed at/ before next visit.       Review of Systems     Objective:   Physical Exam        Assessment & Plan:

## 2015-06-28 ENCOUNTER — Other Ambulatory Visit: Payer: Self-pay

## 2015-06-28 DIAGNOSIS — F5105 Insomnia due to other mental disorder: Secondary | ICD-10-CM

## 2015-06-28 DIAGNOSIS — I10 Essential (primary) hypertension: Secondary | ICD-10-CM

## 2015-06-28 DIAGNOSIS — F419 Anxiety disorder, unspecified: Secondary | ICD-10-CM

## 2015-06-28 MED ORDER — AMLODIPINE BESYLATE 5 MG PO TABS: ORAL_TABLET | ORAL | Status: DC

## 2015-06-28 MED ORDER — CYCLOBENZAPRINE HCL 10 MG PO TABS
ORAL_TABLET | ORAL | Status: DC
Start: 2015-06-28 — End: 2016-03-09

## 2015-06-28 MED ORDER — TEMAZEPAM 7.5 MG PO CAPS: 7.5000 mg | ORAL_CAPSULE | Freq: Every evening | ORAL | Status: DC | PRN

## 2015-06-28 MED ORDER — GABAPENTIN 300 MG PO CAPS
ORAL_CAPSULE | ORAL | Status: DC
Start: 2015-06-28 — End: 2016-03-09

## 2015-06-28 MED ORDER — LISINOPRIL 20 MG PO TABS: 20.0000 mg | ORAL_TABLET | Freq: Every day | ORAL | Status: DC

## 2015-07-17 ENCOUNTER — Other Ambulatory Visit: Payer: Self-pay | Admitting: Family Medicine

## 2015-08-17 ENCOUNTER — Other Ambulatory Visit: Payer: Self-pay | Admitting: Family Medicine

## 2015-08-19 ENCOUNTER — Encounter: Payer: Self-pay | Admitting: Family Medicine

## 2015-08-19 ENCOUNTER — Ambulatory Visit (INDEPENDENT_AMBULATORY_CARE_PROVIDER_SITE_OTHER): Payer: 59 | Admitting: Family Medicine

## 2015-08-19 ENCOUNTER — Other Ambulatory Visit (HOSPITAL_COMMUNITY)
Admission: RE | Admit: 2015-08-19 | Discharge: 2015-08-19 | Disposition: A | Discharge status: Home / Self Care | Payer: 59 | Source: Ambulatory Visit | Attending: Family Medicine | Admitting: Family Medicine

## 2015-08-19 ENCOUNTER — Ambulatory Visit (HOSPITAL_COMMUNITY)
Admission: RE | Admit: 2015-08-19 | Discharge: 2015-08-19 | Disposition: A | Discharge status: Home / Self Care | Payer: 59 | Source: Ambulatory Visit | Attending: Family Medicine | Admitting: Family Medicine

## 2015-08-19 ENCOUNTER — Telehealth: Payer: Self-pay | Admitting: Family Medicine

## 2015-08-19 VITALS — BP 130/84 | HR 98 | Resp 16 | Ht 68.0 in | Wt 204.8 lb

## 2015-08-19 DIAGNOSIS — R531 Weakness: Secondary | ICD-10-CM | POA: Diagnosis not present

## 2015-08-19 DIAGNOSIS — M79605 Pain in left leg: Secondary | ICD-10-CM

## 2015-08-19 DIAGNOSIS — R350 Frequency of micturition: Secondary | ICD-10-CM

## 2015-08-19 DIAGNOSIS — E66811 Obesity, class 1: Secondary | ICD-10-CM

## 2015-08-19 DIAGNOSIS — R7303 Prediabetes: Secondary | ICD-10-CM

## 2015-08-19 DIAGNOSIS — M5410 Radiculopathy, site unspecified: Secondary | ICD-10-CM

## 2015-08-19 DIAGNOSIS — M545 Low back pain, unspecified: Secondary | ICD-10-CM

## 2015-08-19 DIAGNOSIS — Z113 Encounter for screening for infections with a predominantly sexual mode of transmission: Secondary | ICD-10-CM | POA: Diagnosis not present

## 2015-08-19 DIAGNOSIS — G8929 Other chronic pain: Secondary | ICD-10-CM | POA: Insufficient documentation

## 2015-08-19 DIAGNOSIS — Z125 Encounter for screening for malignant neoplasm of prostate: Secondary | ICD-10-CM

## 2015-08-19 DIAGNOSIS — M5136 Other intervertebral disc degeneration, lumbar region: Secondary | ICD-10-CM | POA: Insufficient documentation

## 2015-08-19 DIAGNOSIS — R3915 Urgency of urination: Secondary | ICD-10-CM | POA: Diagnosis not present

## 2015-08-19 DIAGNOSIS — Z1159 Encounter for screening for other viral diseases: Secondary | ICD-10-CM

## 2015-08-19 DIAGNOSIS — I10 Essential (primary) hypertension: Secondary | ICD-10-CM

## 2015-08-19 DIAGNOSIS — E669 Obesity, unspecified: Secondary | ICD-10-CM

## 2015-08-19 DIAGNOSIS — R200 Anesthesia of skin: Secondary | ICD-10-CM | POA: Insufficient documentation

## 2015-08-19 DIAGNOSIS — N3000 Acute cystitis without hematuria: Secondary | ICD-10-CM

## 2015-08-19 LAB — BASIC METABOLIC PANEL
BUN: 14 mg/dL (ref 7–25)
CO2: 29 mmol/L (ref 20–31)
CREATININE: 1.36 mg/dL — AB (ref 0.70–1.33)
Calcium: 9.4 mg/dL (ref 8.6–10.3)
Chloride: 105 mmol/L (ref 98–110)
GLUCOSE: 89 mg/dL (ref 65–99)
Potassium: 4.4 mmol/L (ref 3.5–5.3)
Sodium: 139 mmol/L (ref 135–146)

## 2015-08-19 LAB — POCT URINALYSIS DIPSTICK
BILIRUBIN UA: NEGATIVE
Glucose, UA: NEGATIVE
KETONES UA: NEGATIVE
Leukocytes, UA: NEGATIVE
Nitrite, UA: NEGATIVE
PH UA: 5.5
PROTEIN UA: NEGATIVE
RBC UA: NEGATIVE
SPEC GRAV UA: 1.02
Urobilinogen, UA: 0.2

## 2015-08-19 MED ORDER — SOLIFENACIN SUCCINATE 5 MG PO TABS: 5.0000 mg | ORAL_TABLET | Freq: Every day | ORAL | Status: DC

## 2015-08-19 NOTE — Patient Instructions (Addendum)
F/u in March as before, call if you need me sooner  Labs today.  Xray of back today  New medication to help with urine symptoms, vesicare one daily,  And also to a   Urologist  Please work on good  health habits so that your health will improve. 1. Commitment to daily physical activity for 30 to 60  minutes, if you are able to do this.  2. Commitment to wise food choices. Aim for half of your  food intake to be vegetable and fruit, one quarter starchy foods, and one quarter protein. Try to eat on a regular schedule  3 meals per day, snacking between meals should be limited to vegetables or fruits or small portions of nuts. 64 ounces of water per day is generally recommended, unless you have specific health conditions, like heart failure or kidney failure where you will need to limit fluid intake.  3. Commitment to sufficient and a  good quality of physical and mental rest daily, generally between 6 to 8 hours per day.  WITH PERSISTANCE AND PERSEVERANCE, THE IMPOSSIBLE , BECOMES THE NORM!  Thanks for choosing [facility], we consider it a privelige to serve you.

## 2015-08-19 NOTE — Progress Notes (Signed)
   Subjective:    Patient ID: Bobby Delacruz, male    DOB: [birth year], 55 y.o.   MRN: [id number]  HPI  2 week h/o urinary frequency and urgency, on avg needs to go every 1 hour, stream is normal and quantity is normal.Day and  Night he is symptomatic Denies fever , chills, flank pain , no dysuria Normal sexual function H/o left renal cell cancer with partial nephrectomy in 2011. No f/h of prostate cancer C/o ongoing back pain with weakness and numbness in left lower extremity has had  2 near falls,as a result, the most recent being approximately 1 week ago, wants this further addressed  Review of Systems See HPI Denies recent fever or chills. Denies sinus pressure, nasal congestion, ear pain or sore throat. Denies chest congestion, productive cough or wheezing. Denies chest pains, palpitations and leg swelling Denies abdominal pain, nausea, vomiting,diarrhea or constipation.   Denies dysuria, frequency, hesitancy or incontinence.  Denies headaches, seizures, numbness, or tingling. Denies depression, anxiety or insomnia. Denies skin break down or rash.        Objective:   Physical Exam  BP 130/84 mmHg  Pulse 98  Resp 16  Ht 5\' 8"  (1.727 m)  Wt 204 lb 12.8 oz (92.897 kg)  BMI 31.15 kg/m2  SpO2 97% Patient alert and oriented and in no cardiopulmonary distress.  HEENT: No facial asymmetry, EOMI,   oropharynx pink and moist.  Neck supple no JVD, no mass.  Chest: Clear to auscultation bilaterally.  CVS: S1, S2 no murmurs, no S3.Regular rate.  ABD: Soft non tender. No renal angle tenderness or palpable mass  Ext: No edema  MS: decreased ROM lumbar  spine,  Adequate in shoulders, hips and knees.  Skin: Intact, no ulcerations or rash noted.  Psych: Good eye contact, normal affect. Memory intact not anxious or depressed appearing.  CNS: CN 2-12 intact, grade 4 power in LLE and reduced senasation in left thigh reported on intermittent basis, exam at visit shows no  deficits       Assessment & Plan:  Increased urinary frequency 2 week h/o frequency and urgency, normal UA. Trial of vesicare and refer to urology.   Low back pain radiating to both legs Worsened, with c/o intermittent weakness and numbness in left lower extremity, x ray suggestive of  arthritis and disc disease, refer  For MRI as pt c/o 2 episodes of near falls also due to  weakness  Obesity (BMI 30.0-34.9) Unchnaged Patient re-educated about  the importance of commitment to a  minimum of 150 minutes of exercise per week.  The importance of healthy food choices with portion control discussed. Encouraged to start a food diary, count calories and to consider  joining a support group. Sample diet sheets offered. Goals set by the patient for the next several months.   Weight /BMI 08/19/2015 06/27/2015 12/28/2014  WEIGHT 204 lb 12.8 oz 206 lb 190 lb  HEIGHT 5\' 8"  5\' 8"  -  BMI 31.15 kg/m2 31.33 kg/m2 28.9 kg/m2    Current exercise per week 60 minutes.   Acute cystitis without hematuria  New onset frequency , need to evaluate for infection, negative UA in office, denies change in stream, may be OAB , trial of vesicare and urology eval

## 2015-08-19 NOTE — Telephone Encounter (Signed)
Patient scheduled for this am.

## 2015-08-20 LAB — PSA: PSA: 0.63 ng/mL (ref <=4.00)

## 2015-08-20 LAB — HEPATITIS C ANTIBODY: HCV AB: NEGATIVE

## 2015-08-20 LAB — HIV ANTIBODY (ROUTINE TESTING W REFLEX): HIV: NONREACTIVE

## 2015-08-20 NOTE — Assessment & Plan Note (Signed)
2 week h/o frequency and urgency, normal UA. Trial of vesicare and refer to urology.

## 2015-08-20 NOTE — Assessment & Plan Note (Signed)
New onset frequency , need to evaluate for infection, negative UA in office, denies change in stream, may be OAB , trial of vesicare and urology eval

## 2015-08-20 NOTE — Assessment & Plan Note (Signed)
Unchnaged Patient re-educated about  the importance of commitment to a  minimum of 150 minutes of exercise per week.  The importance of healthy food choices with portion control discussed. Encouraged to start a food diary, count calories and to consider  joining a support group. Sample diet sheets offered. Goals set by the patient for the next several months.   Weight /BMI 08/19/2015 06/27/2015 12/28/2014  WEIGHT 204 lb 12.8 oz 206 lb 190 lb  HEIGHT 5\' 8"  5\' 8"  -  BMI 31.15 kg/m2 31.33 kg/m2 28.9 kg/m2    Current exercise per week 60 minutes.

## 2015-08-20 NOTE — Assessment & Plan Note (Signed)
Worsened, with c/o intermittent weakness and numbness in left lower extremity, x ray suggestive of  arthritis and disc disease, refer  For MRI as pt c/o 2 episodes of near falls also due to  weakness

## 2015-08-23 LAB — URINE CYTOLOGY ANCILLARY ONLY
Chlamydia: NEGATIVE
NEISSERIA GONORRHEA: NEGATIVE
Trichomonas: NEGATIVE

## 2015-08-24 ENCOUNTER — Ambulatory Visit (INDEPENDENT_AMBULATORY_CARE_PROVIDER_SITE_OTHER): Payer: 59 | Admitting: Urology

## 2015-08-24 DIAGNOSIS — N410 Acute prostatitis: Secondary | ICD-10-CM | POA: Diagnosis not present

## 2015-08-24 DIAGNOSIS — R3915 Urgency of urination: Secondary | ICD-10-CM

## 2015-08-24 DIAGNOSIS — R350 Frequency of micturition: Secondary | ICD-10-CM | POA: Diagnosis not present

## 2015-09-28 ENCOUNTER — Ambulatory Visit: Payer: 59 | Admitting: Urology

## 2015-10-11 ENCOUNTER — Telehealth: Payer: Self-pay | Admitting: Family Medicine

## 2015-10-11 DIAGNOSIS — M545 Low back pain, unspecified: Secondary | ICD-10-CM

## 2015-10-11 DIAGNOSIS — M79605 Pain in left leg: Secondary | ICD-10-CM

## 2015-10-11 NOTE — Telephone Encounter (Signed)
Bobby Delacruz is asking for a referral to Dr. Rochele Pages for his back pain, please advise?

## 2015-10-11 NOTE — Telephone Encounter (Signed)
My response is sent to Brandi, pls getr back to me (think it did not get to you!

## 2015-10-11 NOTE — Telephone Encounter (Signed)
I assume he is a pain specialist, if so OK , the Doc there will order any necessary tests ( I think his wife sees that Doc), let me know if my idea is wrong pls   Pls order if correct, I will sign, dx chronic back pain

## 2015-10-11 NOTE — Telephone Encounter (Signed)
Referral done

## 2015-10-11 NOTE — Addendum Note (Signed)
Addended by: Eual Fines on: 10/11/2015 12:56 PM   Modules accepted: Orders

## 2015-11-10 ENCOUNTER — Ambulatory Visit: Payer: Managed Care, Other (non HMO) | Admitting: Family Medicine

## 2015-11-11 LAB — COMPLETE METABOLIC PANEL WITH GFR
ALBUMIN: 4.1 g/dL (ref 3.6–5.1)
ALT: 25 U/L (ref 9–46)
AST: 17 U/L (ref 10–35)
Alkaline Phosphatase: 68 U/L (ref 40–115)
BUN: 9 mg/dL (ref 7–25)
CO2: 24 mmol/L (ref 20–31)
Calcium: 9.4 mg/dL (ref 8.6–10.3)
Chloride: 105 mmol/L (ref 98–110)
Creat: 1 mg/dL (ref 0.70–1.33)
GFR, EST NON AFRICAN AMERICAN: 86 mL/min (ref >=60)
GFR, Est African American: >89 mL/min (ref >=60)
Glucose, Bld: 82 mg/dL (ref 65–99)
POTASSIUM: 4.2 mmol/L (ref 3.5–5.3)
SODIUM: 139 mmol/L (ref 135–146)
TOTAL PROTEIN: 7.4 g/dL (ref 6.1–8.1)
Total Bilirubin: 0.4 mg/dL (ref 0.2–1.2)

## 2015-11-11 LAB — HEMOGLOBIN A1C
Hgb A1c MFr Bld: 5.7 % — ABNORMAL HIGH (ref <5.7)
MEAN PLASMA GLUCOSE: 117 mg/dL

## 2015-12-12 ENCOUNTER — Other Ambulatory Visit: Payer: Self-pay

## 2015-12-12 DIAGNOSIS — I10 Essential (primary) hypertension: Secondary | ICD-10-CM

## 2015-12-12 MED ORDER — LISINOPRIL 20 MG PO TABS: 20.0000 mg | ORAL_TABLET | Freq: Every day | ORAL | Status: DC

## 2016-01-13 ENCOUNTER — Other Ambulatory Visit: Payer: Self-pay

## 2016-01-13 ENCOUNTER — Other Ambulatory Visit: Payer: Self-pay | Admitting: Family Medicine

## 2016-01-13 DIAGNOSIS — I10 Essential (primary) hypertension: Secondary | ICD-10-CM

## 2016-01-13 MED ORDER — LISINOPRIL 20 MG PO TABS: 20.0000 mg | ORAL_TABLET | Freq: Every day | ORAL | Status: DC

## 2016-02-10 ENCOUNTER — Encounter: Payer: 59 | Admitting: Family Medicine

## 2016-03-04 IMAGING — DX DG LUMBAR SPINE 2-3V
3 series · 3 of 3 positions shown · non-contrast
Comparison: CT scan of the abdomen and pelvis dated 11/05/2012

CLINICAL DATA: Chronic back pain radiating to the legs with left
lower extremity weakness and numbness.

EXAM:
LUMBAR SPINE - 2-3 VIEW

[l-spine ap]
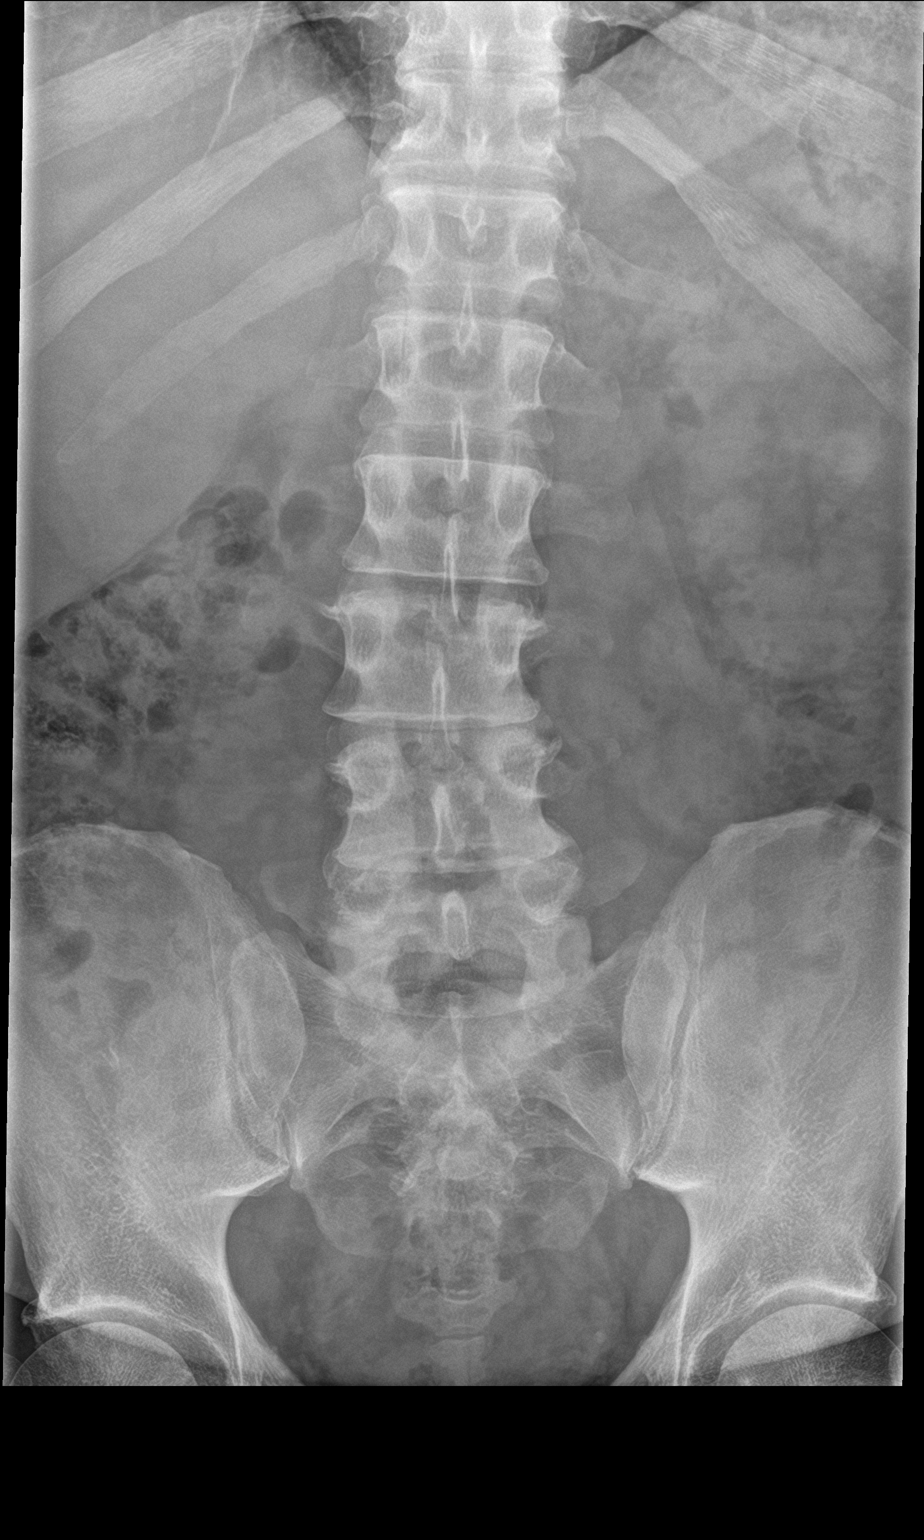

[l-spine lat]
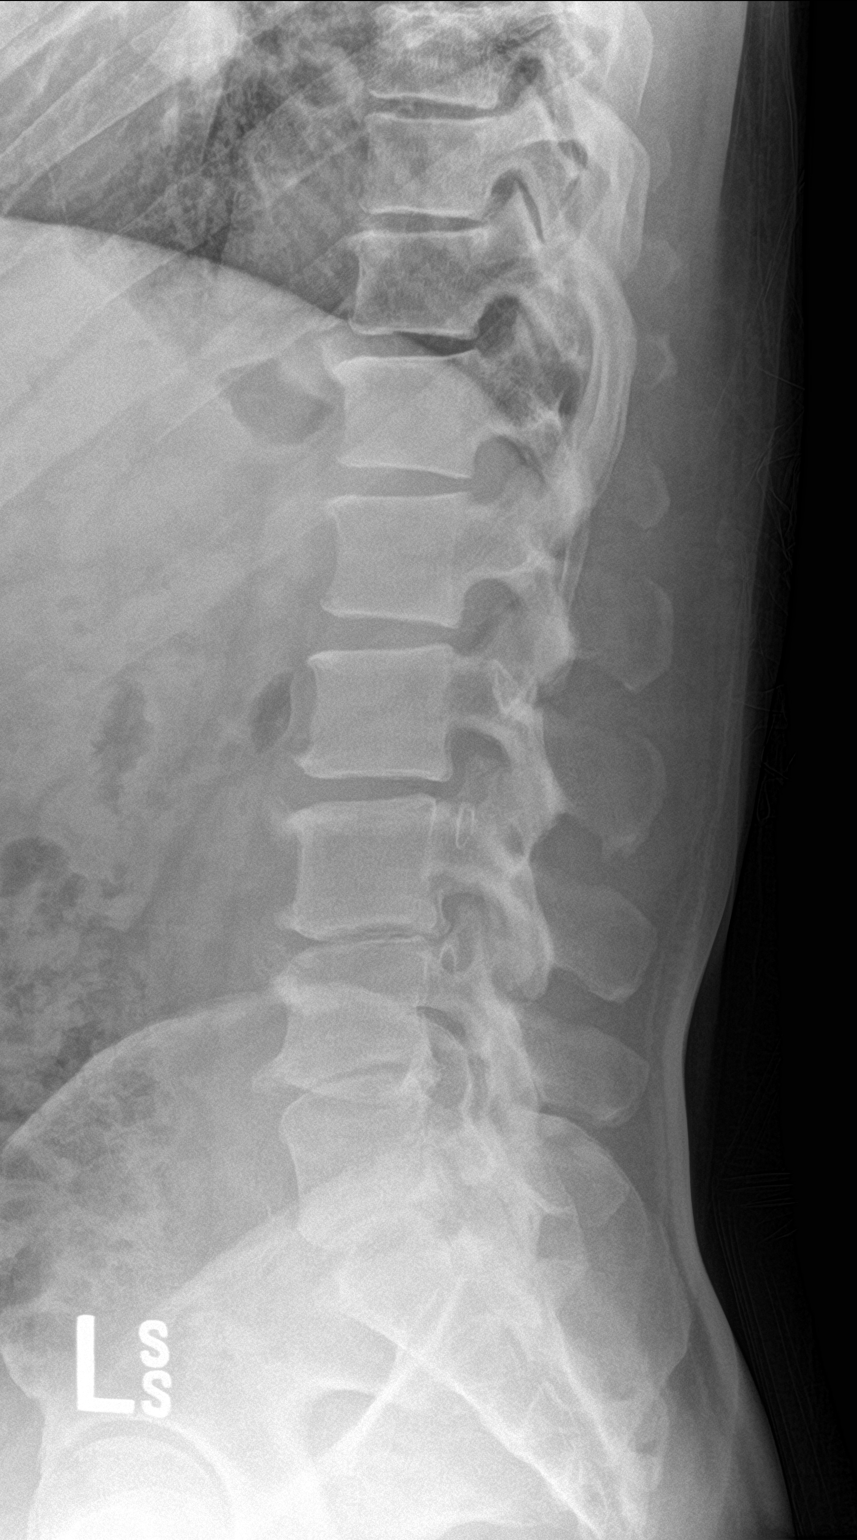

[l-spine spot]
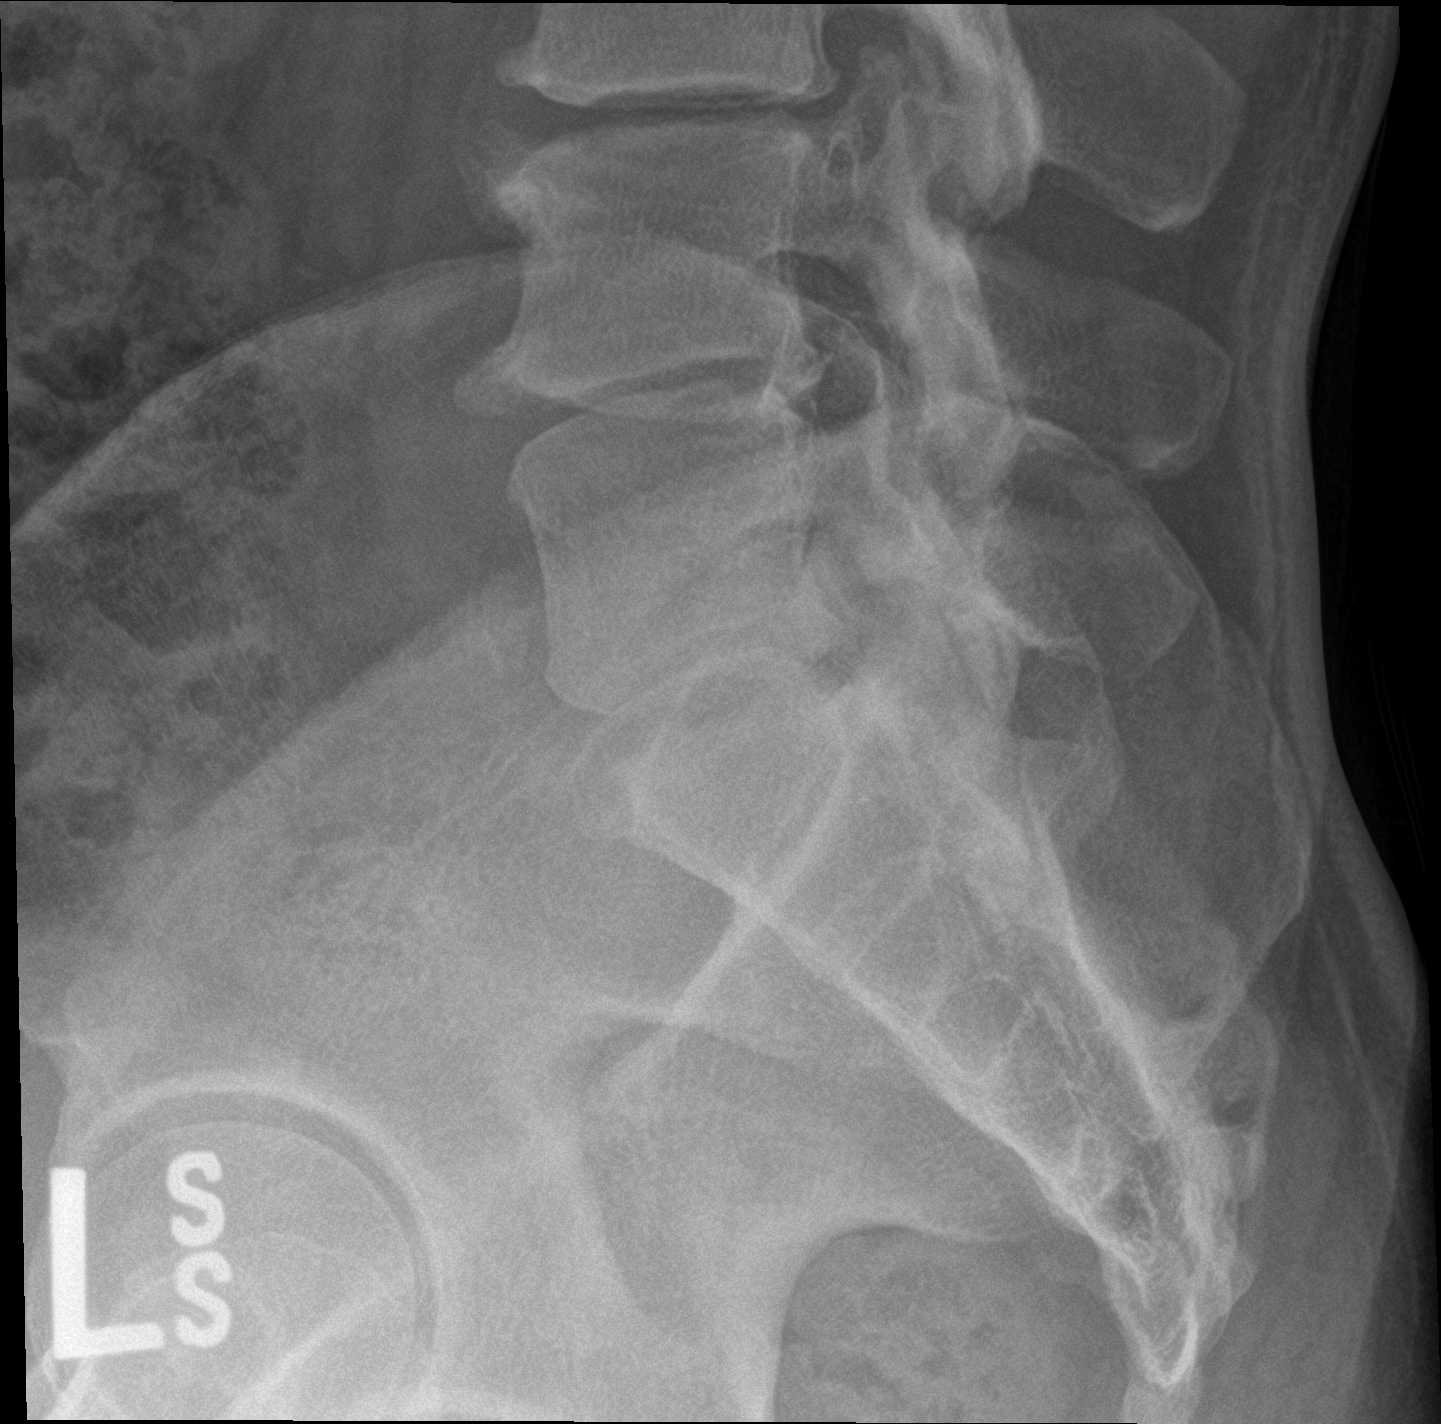

[3 of 3 positions shown; findings below may reference images not displayed]

FINDINGS: There is disc space narrowing at L2-3, L3-4 and L4-5. No
spondylolisthesis. No appreciable facet joint disease.
IMPRESSION: Degenerative disc disease with disc space narrowing from L2-3
through L4-5. No acute abnormalities.

## 2016-03-09 ENCOUNTER — Other Ambulatory Visit: Payer: Self-pay

## 2016-03-09 ENCOUNTER — Ambulatory Visit (INDEPENDENT_AMBULATORY_CARE_PROVIDER_SITE_OTHER): Payer: 59 | Admitting: Family Medicine

## 2016-03-09 ENCOUNTER — Encounter: Payer: Self-pay | Admitting: Family Medicine

## 2016-03-09 VITALS — BP 120/82 | HR 97 | Resp 16 | Ht 68.0 in | Wt 205.0 lb

## 2016-03-09 DIAGNOSIS — Z0000 Encounter for general adult medical examination without abnormal findings: Secondary | ICD-10-CM

## 2016-03-09 DIAGNOSIS — Z1211 Encounter for screening for malignant neoplasm of colon: Secondary | ICD-10-CM

## 2016-03-09 DIAGNOSIS — I10 Essential (primary) hypertension: Secondary | ICD-10-CM

## 2016-03-09 DIAGNOSIS — R7303 Prediabetes: Secondary | ICD-10-CM | POA: Diagnosis not present

## 2016-03-09 DIAGNOSIS — E785 Hyperlipidemia, unspecified: Secondary | ICD-10-CM | POA: Diagnosis not present

## 2016-03-09 DIAGNOSIS — F5105 Insomnia due to other mental disorder: Principal | ICD-10-CM

## 2016-03-09 DIAGNOSIS — F419 Anxiety disorder, unspecified: Secondary | ICD-10-CM

## 2016-03-09 LAB — POC HEMOCCULT BLD/STL (OFFICE/1-CARD/DIAGNOSTIC): Fecal Occult Blood, POC: NEGATIVE

## 2016-03-09 MED ORDER — AMLODIPINE BESYLATE 5 MG PO TABS: ORAL_TABLET | ORAL | 5 refills | Status: DC

## 2016-03-09 MED ORDER — TEMAZEPAM 7.5 MG PO CAPS: 7.5000 mg | ORAL_CAPSULE | Freq: Every evening | ORAL | 3 refills | Status: DC | PRN

## 2016-03-09 NOTE — Patient Instructions (Addendum)
F/u in December, call if you need me sooner  Please work on good  health habits so that your health will improve. 1. Commitment to daily physical activity for 30 to 60  minutes, if you are able to do this.  2. Commitment to wise food choices. Aim for half of your  food intake to be vegetable and fruit, one quarter starchy foods, and one quarter protein. Try to eat on a regular schedule  3 meals per day, snacking between meals should be limited to vegetables or fruits or small portions of nuts. 64 ounces of water per day is generally recommended, unless you have specific health conditions, like heart failure or kidney failure where you will need to limit fluid intake.  3. Commitment to sufficient and a  good quality of physical and mental rest daily, generally between 6 to 8 hours per day.  WITH PERSISTANCE AND PERSEVERANCE, THE IMPOSSIBLE , BECOMES THE NORM!   Fasting lipid, chem7 HBA1C , CBc and TSH in December    Back Exercises If you have pain in your back, do these exercises 2-3 times each day or as told by your doctor. When the pain goes away, do the exercises once each day, but repeat the steps more times for each exercise (do more repetitions). If you do not have pain in your back, do these exercises once each day or as told by your doctor. EXERCISES Single Knee to Chest Do these steps 3-5 times in a row for each leg: 1. Lie on your back on a firm bed or the floor with your legs stretched out. 2. Bring one knee to your chest. 3. Hold your knee to your chest by grabbing your knee or thigh. 4. Pull on your knee until you feel a gentle stretch in your lower back. 5. Keep doing the stretch for 10-30 seconds. 6. Slowly let go of your leg and straighten it. Pelvic Tilt Do these steps 5-10 times in a row: 1. Lie on your back on a firm bed or the floor with your legs stretched out. 2. Bend your knees so they point up to the ceiling. Your feet should be flat on the floor. 3. Tighten  your lower belly (abdomen) muscles to press your lower back against the floor. This will make your tailbone point up to the ceiling instead of pointing down to your feet or the floor. 4. Stay in this position for 5-10 seconds while you gently tighten your muscles and breathe evenly. Cat-Cow Do these steps until your lower back bends more easily: 1. Get on your hands and knees on a firm surface. Keep your hands under your shoulders, and keep your knees under your hips. You may put padding under your knees. 2. Let your head hang down, and make your tailbone point down to the floor so your lower back is round like the back of a cat. 3. Stay in this position for 5 seconds. 4. Slowly lift your head and make your tailbone point up to the ceiling so your back hangs low (sags) like the back of a cow. 5. Stay in this position for 5 seconds. Press-Ups Do these steps 5-10 times in a row: 1. Lie on your belly (face-down) on the floor. 2. Place your hands near your head, about shoulder-width apart. 3. While you keep your back relaxed and keep your hips on the floor, slowly straighten your arms to raise the top half of your body and lift your shoulders. Do not use your  back muscles. To make yourself more comfortable, you may change where you place your hands. 4. Stay in this position for 5 seconds. 5. Slowly return to lying flat on the floor. Bridges Do these steps 10 times in a row: 1. Lie on your back on a firm surface. 2. Bend your knees so they point up to the ceiling. Your feet should be flat on the floor. 3. Tighten your butt muscles and lift your butt off of the floor until your waist is almost as high as your knees. If you do not feel the muscles working in your butt and the back of your thighs, slide your feet 1-2 inches farther away from your butt. 4. Stay in this position for 3-5 seconds. 5. Slowly lower your butt to the floor, and let your butt muscles relax. If this exercise is too easy, try  doing it with your arms crossed over your chest. Belly Crunches Do these steps 5-10 times in a row: 1. Lie on your back on a firm bed or the floor with your legs stretched out. 2. Bend your knees so they point up to the ceiling. Your feet should be flat on the floor. 3. Cross your arms over your chest. 4. Tip your chin a little bit toward your chest but do not bend your neck. 5. Tighten your belly muscles and slowly raise your chest just enough to lift your shoulder blades a tiny bit off of the floor. 6. Slowly lower your chest and your head to the floor. Back Lifts Do these steps 5-10 times in a row: 1. Lie on your belly (face-down) with your arms at your sides, and rest your forehead on the floor. 2. Tighten the muscles in your legs and your butt. 3. Slowly lift your chest off of the floor while you keep your hips on the floor. Keep the back of your head in line with the curve in your back. Look at the floor while you do this. 4. Stay in this position for 3-5 seconds. 5. Slowly lower your chest and your face to the floor. GET HELP IF:  Your back pain gets a lot worse when you do an exercise.  Your back pain does not lessen 2 hours after you exercise. If you have any of these problems, stop doing the exercises. Do not do them again unless your doctor says it is okay. GET HELP RIGHT AWAY IF:  You have sudden, very bad back pain. If this happens, stop doing the exercises. Do not do them again unless your doctor says it is okay.   This information is not intended to replace advice given to you by your health care provider. Make sure you discuss any questions you have with your health care provider.   Document Released: 09/01/2010 Document Revised: 04/20/2015 Document Reviewed: 09/23/2014 Elsevier Interactive Patient Education [employer].

## 2016-03-11 DIAGNOSIS — Z0000 Encounter for general adult medical examination without abnormal findings: Secondary | ICD-10-CM | POA: Insufficient documentation

## 2016-03-11 NOTE — Assessment & Plan Note (Signed)

## 2016-03-11 NOTE — Progress Notes (Signed)
   Bobby Delacruz     MRN: [id number]      DOB: December 10, 1960   HPI: Patient is in for annual physical exam. No other health concerns are expressed or addressed at the visit. Recent labs,  are reviewed. Immunization is reviewed , and  updated if needed.    PE; Pleasant male, alert and oriented x 3, in no cardio-pulmonary distress. Afebrile. HEENT No facial trauma or asymetry. Sinuses non tender. EOMI, pupils equally reactive to light. External ears normal, tympanic membranes clear. Oropharynx moist, no exudate. Neck: supple, no adenopathy,JVD or thyromegaly.No bruits.  Chest: Clear to ascultation bilaterally.No crackles or wheezes. Non tender to palpation  Breast: No asymetry,no masses. No nipple discharge or inversion. No axillary or supraclavicular adenopathy  Cardiovascular system; Heart sounds normal,  S1 and  S2 ,no S3.  No murmur, or thrill. Apical beat not displaced Peripheral pulses normal.  Abdomen: Soft, non tender, no organomegaly or masses. No bruits. Bowel sounds normal. No guarding, tenderness or rebound.  Rectal:  Normal sphincter tone. No hemorrhoids or  masses. guaiac negative stool. Prostate smooth and firm    Musculoskeletal exam: Full ROM of spine, hips , shoulders and knees. No deformity ,swelling or crepitus noted. No muscle wasting or atrophy.   Neurologic: Cranial nerves 2 to 12 intact. Power, tone ,sensation and reflexes normal throughout. No disturbance in gait. No tremor.  Skin: Intact, no ulceration, erythema , scaling or rash noted. Pigmentation normal throughout  Psych; Normal mood and affect. Judgement and concentration normal   Assessment & Plan:  Encounter for annual physical exam Annual exam as documented. Counseling done  re healthy lifestyle involving commitment to 150 minutes exercise per week, heart healthy diet, and attaining healthy weight.The importance of adequate sleep also discussed. Regular seat belt use  and home safety, is also discussed. Changes in health habits are decided on by the patient with goals and time frames  set for achieving them. Immunization and cancer screening needs are specifically addressed at this visit.

## 2016-04-08 ENCOUNTER — Other Ambulatory Visit: Payer: Self-pay | Admitting: Family Medicine

## 2016-07-13 ENCOUNTER — Ambulatory Visit: Payer: 59 | Admitting: Family Medicine

## 2016-09-04 ENCOUNTER — Encounter (HOSPITAL_COMMUNITY): Payer: Self-pay | Admitting: Emergency Medicine

## 2016-09-04 ENCOUNTER — Emergency Department (HOSPITAL_COMMUNITY)
Admission: EM | Admit: 2016-09-04 | Discharge: 2016-09-04 | Disposition: A | Discharge status: Home / Self Care | Payer: 59 | Attending: Emergency Medicine | Admitting: Emergency Medicine

## 2016-09-04 ENCOUNTER — Telehealth: Payer: Self-pay

## 2016-09-04 DIAGNOSIS — Z79899 Other long term (current) drug therapy: Secondary | ICD-10-CM | POA: Diagnosis not present

## 2016-09-04 DIAGNOSIS — Z85528 Personal history of other malignant neoplasm of kidney: Secondary | ICD-10-CM | POA: Insufficient documentation

## 2016-09-04 DIAGNOSIS — H8112 Benign paroxysmal vertigo, left ear: Secondary | ICD-10-CM

## 2016-09-04 DIAGNOSIS — H8110 Benign paroxysmal vertigo, unspecified ear: Secondary | ICD-10-CM

## 2016-09-04 DIAGNOSIS — I10 Essential (primary) hypertension: Secondary | ICD-10-CM | POA: Insufficient documentation

## 2016-09-04 DIAGNOSIS — Z87891 Personal history of nicotine dependence: Secondary | ICD-10-CM | POA: Insufficient documentation

## 2016-09-04 DIAGNOSIS — R42 Dizziness and giddiness: Secondary | ICD-10-CM | POA: Diagnosis present

## 2016-09-04 MED ORDER — MECLIZINE HCL 25 MG PO TABS: 25.0000 mg | ORAL_TABLET | Freq: Three times a day (TID) | ORAL | 0 refills | Status: DC | PRN

## 2016-09-04 MED ORDER — DIAZEPAM 5 MG PO TABS: 5.0000 mg | ORAL_TABLET | Freq: Two times a day (BID) | ORAL | 0 refills | Status: DC | PRN

## 2016-09-04 MED ORDER — MECLIZINE HCL 25 MG PO TABS
50.0000 mg | ORAL_TABLET | Freq: Once | ORAL | Status: AC
  Administered 2016-09-04: 50 mg via ORAL
  Filled 2016-09-04: qty 2

## 2016-09-04 MED ORDER — DIAZEPAM 5 MG/ML IJ SOLN
5.0000 mg | Freq: Once | INTRAMUSCULAR | Status: AC
  Administered 2016-09-04: 5 mg via INTRAVENOUS
  Filled 2016-09-04: qty 2

## 2016-09-04 NOTE — Telephone Encounter (Signed)
Referral entered  

## 2016-09-04 NOTE — ED Notes (Signed)
Discharge instructions, follow up care, and rx x2 reviewed with patient. Patient verbalized understanding. 

## 2016-09-04 NOTE — Telephone Encounter (Signed)
Try and get asap ENT eval for acute severe vertigo, in [place] often see them within 1 to 2 days, maneuver in the eD didi not work , I reviewed the ED note already

## 2016-09-04 NOTE — ED Provider Notes (Signed)
[facility] DEPT Provider Note   CSN: [id number] Arrival date & time: 09/04/16  [id number]     History   Chief Complaint Chief Complaint  Patient presents with  . Dizziness    HPI Bobby Delacruz is a 56 y.o. male.  The history is provided by the patient.  Dizziness  Quality:  Room spinning Severity:  Moderate Onset quality:  Gradual Duration:  1 day Timing:  Intermittent Progression:  Waxing and waning Chronicity:  New Context comment:  Sitting up from laying, turning head Relieved by:  Nothing Worsened by:  Sitting upright and turning head Ineffective treatments:  None tried Associated symptoms: nausea   Associated symptoms: no tinnitus, no vision changes and no vomiting     Past Medical History:  Diagnosis Date  . Cancer ([facility])   . Enlarged heart   . Hypertension 2009  . Kidney cancer, primary, with metastasis from kidney to other site [facility]) 2011, left   partial nephrectomy , cured  . Migraine     Patient Active Problem List   Diagnosis Date Noted  . Encounter for annual physical exam 03/11/2016  . Low back pain radiating to both legs 12/20/2014  . Insomnia secondary to anxiety 07/15/2014  . Allergic rhinitis 10/22/2013  . Prediabetes 09/06/2013  . Metabolic syndrome X [id number]  . Dyslipidemia 09/06/2013  . HTN (hypertension) 09/03/2013  . Obesity (BMI 30.0-34.9) 09/03/2013    Past Surgical History:  Procedure Laterality Date  . LAPAROSCOPIC NEPHRECTOMY Left 2011   [facility]        Home Medications    Prior to Admission medications   Medication Sig Start Date End Date Taking? Authorizing Provider  amLODipine (NORVASC) 5 MG tablet TAKE 1 TABLET (5 MG TOTAL) BY MOUTH DAILY. Patient taking differently: Take 5 mg by mouth each morning 04/08/16  Yes Fayrene Helper, MD  fish oil-omega-3 fatty acids 1000 MG capsule Take 1 g by mouth every morning.    Yes Historical Provider, MD  fluticasone (FLONASE) 50 MCG/ACT nasal spray Place 2 sprays into both  nostrils daily. Patient taking differently: Place 2 sprays into both nostrils daily as needed for allergies.  02/25/15  Yes Fayrene Helper, MD  lisinopril (PRINIVIL,ZESTRIL) 20 MG tablet Take 1 tablet (20 mg total) by mouth daily. Patient taking differently: Take 20 mg by mouth every morning.  01/13/16  Yes Fayrene Helper, MD  Lysine HCl 500 MG TABS Take 1,000 mg by mouth every morning.   Yes Historical Provider, MD  Multiple Vitamin (MULTIVITAMIN WITH MINERALS) TABS Take 1 tablet by mouth every morning.    Yes Historical Provider, MD  oxyCODONE-acetaminophen (PERCOCET) 7.5-325 MG tablet Take 1 tablet by mouth every 8 (eight) hours as needed for moderate pain or severe pain.  08/07/16  Yes Historical Provider, MD  temazepam (RESTORIL) 7.5 MG capsule Take 1 capsule (7.5 mg total) by mouth at bedtime as needed for sleep. 03/09/16  Yes Fayrene Helper, MD  vitamin C (ASCORBIC ACID) 500 MG tablet Take 500 mg by mouth every morning.    Yes Historical Provider, MD    Family History Family History  Problem Relation Age of Onset  . Hypertension Mother   . Hypertension Father   . Heart murmur Father   . Hypertension Sister     Social History Social History  Substance Use Topics  . Smoking status: Former Smoker    Quit date: 09/03/2002  . Smokeless tobacco: Never Used  . Alcohol use No  Comment: former      Allergies   Other   Review of Systems Review of Systems  HENT: Negative for tinnitus.   Gastrointestinal: Positive for nausea. Negative for vomiting.  Neurological: Positive for dizziness.  All other systems reviewed and are negative.    Physical Exam Updated Vital Signs BP (!) 141/108 (BP Location: Left Arm)   Pulse 75   Temp 98.4 F (36.9 C) (Oral)   Resp 18   Ht 5\' 8"  (1.727 m)   Wt 205 lb (93 kg)   SpO2 98%   BMI 31.17 kg/m   Physical Exam  Constitutional: He is oriented to person, place, and time. He appears well-developed and well-nourished. No  distress.  HENT:  Head: Normocephalic and atraumatic.  Nose: Nose normal.  Eyes: Conjunctivae and EOM are normal. Pupils are equal, round, and reactive to light.  Neck: Neck supple. No tracheal deviation present.  Cardiovascular: Normal rate and regular rhythm.   Pulmonary/Chest: Effort normal. No respiratory distress.  Abdominal: Soft. He exhibits no distension.  Neurological: He is alert and oriented to person, place, and time. He has normal strength. No cranial nerve deficit. Coordination and gait normal.  3 beats of nystagmus on right and 6 beats of nystagmus on left with positive left Dix-Hallpike and head impulse test on the left, normal test of skew  Skin: Skin is warm and dry.  Psychiatric: He has a normal mood and affect.  Vitals reviewed.    ED Treatments / Results  Labs (all labs ordered are listed, but only abnormal results are displayed) Labs Reviewed - No data to display  EKG  EKG Interpretation  Date/Time:  Tuesday September 04 2016 06:45:20 EST Ventricular Rate:  77 PR Interval:    QRS Duration: 92 QT Interval:  360 QTC Calculation: 408 R Axis:   63 Text Interpretation:  Sinus rhythm Normal ECG No significant change since last tracing Confirmed by Inaaya Vellucci MD, Arely Tinner [id number]) on 09/04/2016 8:23:43 AM       Radiology No results found.  Procedures Procedures (including critical care time)  Medications Ordered in ED Medications  meclizine (ANTIVERT) tablet 50 mg (50 mg Oral Given 09/04/16 0902)  diazepam (VALIUM) injection 5 mg (5 mg Intravenous Given 09/04/16 0959)     Initial Impression / Assessment and Plan / ED Course  I have reviewed the triage vital signs and the nursing notes.  Pertinent labs & imaging results that were available during my care of the patient were reviewed by me and considered in my medical decision making (see chart for details).     56 year old male presents with typical peripheral vertigo symptoms that were sudden onset yesterday  that have been waxing and waning with intermittent nausea. He has worsening symptoms with left head turning and sitting up from a lying position. Has multiple findings on HINTS exam suggesting peripheral etiology. I performed Apley maneuver on patient without relief of symptoms and discussed ongoing therapy at home to try to improve position of suspected otolith. Given meclizine here with minimal response, IV Valium improved symptoms, plan for medical therapy and monitoring to resolution. Plan to follow up with PCP as needed and return precautions discussed for worsening or new concerning symptoms.   Final Clinical Impressions(s) / ED Diagnoses   Final diagnoses:  BPPV (benign paroxysmal positional vertigo), left    New Prescriptions New Prescriptions   MECLIZINE (ANTIVERT) 25 MG TABLET    Take 1 tablet (25 mg total) by mouth 3 (three) times daily as  needed for dizziness.     Leo Grosser, MD 09/04/16 1020

## 2016-09-04 NOTE — ED Triage Notes (Signed)
Pt states yesterday at work he felt dizzy so he went home showered, got something to eat, and laid down  Pt states when he laid down the dizziness was worse   Pt states this morning the dizziness is worse and he has a "twinge" in his chest   Denies pain

## 2016-09-07 ENCOUNTER — Ambulatory Visit: Payer: 59 | Admitting: Family Medicine

## 2016-10-20 ENCOUNTER — Other Ambulatory Visit: Payer: Self-pay | Admitting: Family Medicine

## 2016-10-20 DIAGNOSIS — I10 Essential (primary) hypertension: Secondary | ICD-10-CM

## 2016-12-07 ENCOUNTER — Telehealth: Payer: Self-pay | Admitting: Family Medicine

## 2016-12-07 DIAGNOSIS — R7303 Prediabetes: Secondary | ICD-10-CM

## 2016-12-07 DIAGNOSIS — I10 Essential (primary) hypertension: Secondary | ICD-10-CM

## 2016-12-07 DIAGNOSIS — E785 Hyperlipidemia, unspecified: Secondary | ICD-10-CM

## 2016-12-07 NOTE — Telephone Encounter (Signed)
Please schedule a may appt due to elevated blood pressure. Advise him to do fasting labs before the visit at [facility]. Thanks

## 2016-12-07 NOTE — Telephone Encounter (Signed)
pls call pt, let him know I recently  Got communication from [place] with uncontrolled BP, needs med adjustment and OV in the interim, has not been in since last July, and had labs ordered then still not done, needs those labs 3 days before his visit, schedule May visit if he agrees please, thanks

## 2016-12-25 ENCOUNTER — Other Ambulatory Visit: Payer: Self-pay | Admitting: Family Medicine

## 2016-12-25 DIAGNOSIS — J309 Allergic rhinitis, unspecified: Secondary | ICD-10-CM

## 2016-12-27 ENCOUNTER — Encounter: Payer: Self-pay | Admitting: Family Medicine

## 2016-12-27 ENCOUNTER — Ambulatory Visit: Payer: 59 | Admitting: Family Medicine

## 2017-01-23 ENCOUNTER — Encounter: Payer: Self-pay | Admitting: Family Medicine

## 2017-01-23 ENCOUNTER — Ambulatory Visit: Payer: 59 | Admitting: Family Medicine

## 2017-02-08 ENCOUNTER — Encounter: Payer: Self-pay | Admitting: Family Medicine

## 2017-03-11 ENCOUNTER — Other Ambulatory Visit: Payer: Self-pay | Admitting: Family Medicine

## 2017-03-11 ENCOUNTER — Other Ambulatory Visit: Payer: Self-pay

## 2017-03-11 DIAGNOSIS — I10 Essential (primary) hypertension: Secondary | ICD-10-CM

## 2017-03-11 MED ORDER — LISINOPRIL 20 MG PO TABS: 20.0000 mg | ORAL_TABLET | Freq: Every day | ORAL | 0 refills | Status: DC

## 2017-03-11 MED ORDER — AMLODIPINE BESYLATE 5 MG PO TABS: ORAL_TABLET | ORAL | 0 refills | Status: DC

## 2017-05-16 ENCOUNTER — Ambulatory Visit (INDEPENDENT_AMBULATORY_CARE_PROVIDER_SITE_OTHER): Payer: Managed Care, Other (non HMO) | Admitting: Family Medicine

## 2017-05-16 ENCOUNTER — Encounter: Payer: Self-pay | Admitting: Family Medicine

## 2017-05-16 VITALS — BP 130/92 | HR 62 | Temp 98.2°F | Resp 16 | Ht 68.0 in | Wt 218.2 lb

## 2017-05-16 DIAGNOSIS — R7303 Prediabetes: Secondary | ICD-10-CM

## 2017-05-16 DIAGNOSIS — Z125 Encounter for screening for malignant neoplasm of prostate: Secondary | ICD-10-CM

## 2017-05-16 DIAGNOSIS — I10 Essential (primary) hypertension: Secondary | ICD-10-CM | POA: Diagnosis not present

## 2017-05-16 DIAGNOSIS — M545 Low back pain, unspecified: Secondary | ICD-10-CM

## 2017-05-16 DIAGNOSIS — E669 Obesity, unspecified: Secondary | ICD-10-CM

## 2017-05-16 DIAGNOSIS — Z23 Encounter for immunization: Secondary | ICD-10-CM | POA: Diagnosis not present

## 2017-05-16 DIAGNOSIS — E785 Hyperlipidemia, unspecified: Secondary | ICD-10-CM

## 2017-05-16 DIAGNOSIS — M79605 Pain in left leg: Secondary | ICD-10-CM

## 2017-05-16 LAB — PSA: PSA: 0.9 ng/mL (ref <=4.0)

## 2017-05-16 NOTE — Patient Instructions (Signed)
Physical exam in 2nd week in January, call if you need me before  Flu vaccine today   It is important that you exercise regularly at least 30 minutes 5 times a week. If you develop chest pain, have severe difficulty breathing, or feel very tired, stop exercising immediately and seek medical attention    Please work on good  health habits so that your health will improve. 1. Commitment to daily physical activity for 30 to 60  minutes, if you are able to do this.  2. Commitment to wise food choices. Aim for half of your  food intake to be vegetable and fruit, one quarter starchy foods, and one quarter protein. Try to eat on a regular schedule  3 meals per day, snacking between meals should be limited to vegetables or fruits or small portions of nuts. 64 ounces of water per day is generally recommended, unless you have specific health conditions, like heart failure or kidney failure where you will need to limit fluid intake.  3. Commitment to sufficient and a  good quality of physical and mental rest daily, generally between 6 to 8 hours per day.  WITH PERSISTANCE AND PERSEVERANCE, THE IMPOSSIBLE , BECOMES THE NORM!   Blood pressure is too high, need to lose weight , exercise and stop canned and processed foods  Thank you  for choosing [facility]. We consider it a privelige to serve you.  Delivering excellent health care in a caring and  compassionate way is our goal.  Partnering with you,  so that together we can achieve this goal is our strategy.

## 2017-05-17 ENCOUNTER — Encounter: Payer: Self-pay | Admitting: Family Medicine

## 2017-05-17 LAB — BASIC METABOLIC PANEL WITH GFR
BUN: 13 mg/dL (ref 7–25)
CALCIUM: 9.5 mg/dL (ref 8.6–10.3)
CHLORIDE: 105 mmol/L (ref 98–110)
CO2: 29 mmol/L (ref 20–32)
Creat: 1.19 mg/dL (ref 0.70–1.33)
GFR, Est African American: 80 mL/min/{1.73_m2} (ref >=60)
GFR, Est Non African American: 69 mL/min/{1.73_m2} (ref >=60)
Glucose, Bld: 99 mg/dL (ref 65–99)
POTASSIUM: 4.7 mmol/L (ref 3.5–5.3)
Sodium: 140 mmol/L (ref 135–146)

## 2017-05-17 LAB — TSH: TSH: 2.54 mIU/L (ref 0.40–4.50)

## 2017-05-17 LAB — LIPID PANEL
Cholesterol: 196 mg/dL (ref <200)
HDL: 36 mg/dL — ABNORMAL LOW (ref >40)
LDL Cholesterol (Calc): 135 mg/dL (calc) — ABNORMAL HIGH
Non-HDL Cholesterol (Calc): 160 mg/dL (calc) — ABNORMAL HIGH (ref <130)
Total CHOL/HDL Ratio: 5.4 (calc) — ABNORMAL HIGH (ref <5.0)
Triglycerides: 127 mg/dL (ref <150)

## 2017-05-17 LAB — CBC
HCT: 42.6 % (ref 38.5–50.0)
Hemoglobin: 13.9 g/dL (ref 13.2–17.1)
MCH: 25.7 pg — ABNORMAL LOW (ref 27.0–33.0)
MCHC: 32.6 g/dL (ref 32.0–36.0)
MCV: 78.7 fL — ABNORMAL LOW (ref 80.0–100.0)
MPV: 10.9 fL (ref 7.5–12.5)
PLATELETS: 270 10*3/uL (ref 140–400)
RBC: 5.41 10*6/uL (ref 4.20–5.80)
RDW: 13.7 % (ref 11.0–15.0)
WBC: 5.4 10*3/uL (ref 3.8–10.8)

## 2017-05-17 LAB — HEMOGLOBIN A1C
HEMOGLOBIN A1C: 5.9 %{Hb} — AB (ref <5.7)
MEAN PLASMA GLUCOSE: 123 (calc)
eAG (mmol/L): 6.8 (calc)

## 2017-05-20 ENCOUNTER — Encounter: Payer: Self-pay | Admitting: Family Medicine

## 2017-05-25 ENCOUNTER — Encounter: Payer: Self-pay | Admitting: Family Medicine

## 2017-05-25 NOTE — Assessment & Plan Note (Signed)
Hyperlipidemia:Low fat diet discussed and encouraged.   Lipid Panel  Lab Results  Component Value Date   CHOL 160 12/20/2014   HDL 46 12/20/2014   LDLCALC 99 12/20/2014   TRIG 77 12/20/2014   CHOLHDL 3.5 12/20/2014

## 2017-05-25 NOTE — Assessment & Plan Note (Signed)
Patient educated about the importance of limiting  Carbohydrate intake , the need to commit to daily physical activity for a minimum of 30 minutes , and to commit weight loss. The fact that changes in all these areas will reduce or eliminate all together the development of diabetes is stressed.   Diabetic Labs Latest Ref Rng & Units 11/10/2015 08/19/2015 12/20/2014 12/20/2014 07/15/2014  HbA1c <5.7 % 5.7(H) - - 5.9(H) 6.0(H)  Chol 0 - 200 mg/dL - - - 160 -  HDL >=40 mg/dL - - - 46 -  Calc LDL 0 - 99 mg/dL - - - 99 -  Triglycerides <150 mg/dL - - - 77 -  Creatinine 0.70 - 1.33 mg/dL 1.00 1.36(H) 1.28 1.38(H) 1.08   BP/Weight 05/16/2017 09/04/2016 03/09/2016 08/19/2015 06/27/2015 6/55/3748 09/19/784  Systolic BP 754 492 010 071 219 758 832  Diastolic BP 92 98 82 84 90 84 80  Wt. (Lbs) 218.25 205 205 204.8 206 190 193.04  BMI 33.18 31.17 31.17 31.15 31.33 28.9 29.36   No flowsheet data found.  Updated lab needed at/ before next visit.;

## 2017-05-25 NOTE — Progress Notes (Signed)
Bobby Delacruz     MRN: [id number]      DOB: 06-23-1961   HPI Bobby Delacruz is here for follow up and re-evaluation of chronic medical conditions, medication management and review of any available recent lab and radiology data.  Preventive health is updated, specifically  Cancer screening and Immunization.   Questions or concerns regarding consultations or procedures which Bobby Delacruz has had in Bobby interim are  addressed. Bobby Delacruz denies any adverse reactions to current medications since Bobby last visit.  There are no new concerns.  There are no specific complaints   ROS Denies recent fever or chills. Denies sinus pressure, nasal congestion, ear pain or sore throat. Denies chest congestion, productive cough or wheezing. Denies chest pains, palpitations and leg swelling Denies abdominal pain, nausea, vomiting,diarrhea or constipation.   Denies dysuria, frequency, hesitancy or incontinence. Denies joint pain, swelling and limitation in mobility. Denies headaches, seizures, numbness, or tingling. Denies depression, anxiety or insomnia. Denies skin break down or rash. No regular exercise, eating anything he wants with weigt gain  PE  BP (!) 130/92 (BP Location: Left Arm, Bobby Delacruz Position: Sitting, Cuff Size: Normal)   Pulse 62   Temp 98.2 F (36.8 C) (Other (Comment))   Resp 16   Ht 5\' 8"  (1.727 m)   Wt 218 lb 4 oz (99 kg)   SpO2 98%   BMI 33.18 kg/m   Bobby Delacruz alert and oriented and in no cardiopulmonary distress.  HEENT: No facial asymmetry, EOMI,   oropharynx pink and moist.  Neck supple no JVD, no mass.  Chest: Clear to auscultation bilaterally.  CVS: S1, S2 no murmurs, no S3.Regular rate.  ABD: Soft non tender.   Ext: No edema  MS: Adequate ROM spine, shoulders, hips and knees.  Skin: Intact, no ulcerations or rash noted.  Psych: Good eye contact, normal affect. Memory intact not anxious or depressed appearing.  CNS: CN 2-12 intact, power,  normal throughout.no focal  deficits noted.   Assessment & Plan  HTN (hypertension) Uncontrolled, no med change, lifestyle modification with weight loss DASH diet and commitment to daily physical activity for a minimum of 30 minutes discussed and encouraged, as a part of hypertension management. Bobby importance of attaining a healthy weight is also discussed.  BP/Weight 05/16/2017 09/04/2016 03/09/2016 08/19/2015 06/27/2015 1/51/7616 0/02/3709  Systolic BP 626 948 546 270 350 093 818  Diastolic BP 92 98 82 84 90 84 80  Wt. (Lbs) 218.25 205 205 204.8 206 190 193.04  BMI 33.18 31.17 31.17 31.15 31.33 28.9 29.36       Dyslipidemia Hyperlipidemia:Low fat diet discussed and encouraged.   Lipid Panel  Lab Results  Component Value Date   CHOL 160 12/20/2014   HDL 46 12/20/2014   LDLCALC 99 12/20/2014   TRIG 77 12/20/2014   CHOLHDL 3.5 12/20/2014       Low back pain radiating to both legs Managemd by chronic pain management in [place]  Obesity (BMI 30.0-34.9) Deteriorated. Bobby Delacruz re-educated about  Bobby importance of commitment to a  minimum of 150 minutes of exercise per week.  Bobby importance of healthy food choices with portion control discussed. Encouraged to start a food diary, count calories and to consider  joining a support group. Sample diet sheets offered. Goals set by Bobby Bobby Delacruz for Bobby next several months.   Weight /BMI 05/16/2017 09/04/2016 03/09/2016  WEIGHT 218 lb 4 oz 205 lb 205 lb  HEIGHT 5\' 8"  5\' 8"  5\' 8"   BMI  33.18 kg/m2 31.17 kg/m2 31.17 kg/m2      Prediabetes Bobby Delacruz educated about Bobby importance of limiting  Carbohydrate intake , Bobby need to commit to daily physical activity for a minimum of 30 minutes , and to commit weight loss. Bobby fact that changes in all these areas will reduce or eliminate all together Bobby development of diabetes is stressed.   Diabetic Labs Latest Ref Rng & Units 11/10/2015 08/19/2015 12/20/2014 12/20/2014 07/15/2014  HbA1c <5.7 % 5.7(H) - - 5.9(H) 6.0(H)    Chol 0 - 200 mg/dL - - - 160 -  HDL >=40 mg/dL - - - 46 -  Calc LDL 0 - 99 mg/dL - - - 99 -  Triglycerides <150 mg/dL - - - 77 -  Creatinine 0.70 - 1.33 mg/dL 1.00 1.36(H) 1.28 1.38(H) 1.08   BP/Weight 05/16/2017 09/04/2016 03/09/2016 08/19/2015 06/27/2015 0/96/4383 03/13/8402  Systolic BP 754 360 677 034 035 248 185  Diastolic BP 92 98 82 84 90 84 80  Wt. (Lbs) 218.25 205 205 204.8 206 190 193.04  BMI 33.18 31.17 31.17 31.15 31.33 28.9 29.36   No flowsheet data found.  Updated lab needed at/ before next visit.;

## 2017-05-25 NOTE — Assessment & Plan Note (Signed)
Deteriorated. Patient re-educated about  the importance of commitment to a  minimum of 150 minutes of exercise per week.  The importance of healthy food choices with portion control discussed. Encouraged to start a food diary, count calories and to consider  joining a support group. Sample diet sheets offered. Goals set by the patient for the next several months.   Weight /BMI 05/16/2017 09/04/2016 03/09/2016  WEIGHT 218 lb 4 oz 205 lb 205 lb  HEIGHT 5\' 8"  5\' 8"  5\' 8"   BMI 33.18 kg/m2 31.17 kg/m2 31.17 kg/m2

## 2017-05-25 NOTE — Assessment & Plan Note (Signed)
Managemd by chronic pain management in [place]

## 2017-05-25 NOTE — Assessment & Plan Note (Signed)
Uncontrolled, no med change, lifestyle modification with weight loss DASH diet and commitment to daily physical activity for a minimum of 30 minutes discussed and encouraged, as a part of hypertension management. The importance of attaining a healthy weight is also discussed.  BP/Weight 05/16/2017 09/04/2016 03/09/2016 08/19/2015 06/27/2015 11/21/3011 08/16/3886  Systolic BP 757 972 820 601 561 537 943  Diastolic BP 92 98 82 84 90 84 80  Wt. (Lbs) 218.25 205 205 204.8 206 190 193.04  BMI 33.18 31.17 31.17 31.15 31.33 28.9 29.36

## 2017-06-03 ENCOUNTER — Encounter: Payer: Self-pay | Admitting: Family Medicine

## 2017-06-15 ENCOUNTER — Other Ambulatory Visit: Payer: Self-pay | Admitting: Family Medicine

## 2017-06-19 ENCOUNTER — Other Ambulatory Visit: Payer: Self-pay

## 2017-06-19 ENCOUNTER — Telehealth: Payer: Self-pay | Admitting: *Deleted

## 2017-06-19 DIAGNOSIS — I10 Essential (primary) hypertension: Secondary | ICD-10-CM

## 2017-06-19 MED ORDER — AMLODIPINE BESYLATE 5 MG PO TABS: 5.0000 mg | ORAL_TABLET | Freq: Every day | ORAL | 1 refills | Status: DC

## 2017-06-19 MED ORDER — LISINOPRIL 20 MG PO TABS: 20.0000 mg | ORAL_TABLET | Freq: Every day | ORAL | 1 refills | Status: DC

## 2017-06-19 NOTE — Telephone Encounter (Signed)
Med refills sent

## 2017-06-19 NOTE — Telephone Encounter (Signed)
Patient called requesting to speak with Brandi. Patient did not leave message with me, he asked if I would have [facility] call him. [phone]

## 2017-08-21 ENCOUNTER — Encounter: Payer: Managed Care, Other (non HMO) | Admitting: Family Medicine

## 2017-10-01 ENCOUNTER — Telehealth: Payer: Self-pay | Admitting: Family Medicine

## 2017-10-01 ENCOUNTER — Other Ambulatory Visit: Payer: Self-pay

## 2017-10-01 MED ORDER — AMLODIPINE BESYLATE 5 MG PO TABS: 5.0000 mg | ORAL_TABLET | Freq: Every day | ORAL | 0 refills | Status: DC

## 2017-10-01 NOTE — Telephone Encounter (Signed)
1 refill sent

## 2017-10-01 NOTE — Telephone Encounter (Signed)
Patient is requesting a refill for amlodopine

## 2018-01-03 ENCOUNTER — Other Ambulatory Visit: Payer: Self-pay | Admitting: Family Medicine

## 2018-01-03 DIAGNOSIS — I10 Essential (primary) hypertension: Secondary | ICD-10-CM

## 2018-07-11 ENCOUNTER — Other Ambulatory Visit: Payer: Self-pay | Admitting: Family Medicine

## 2018-08-09 ENCOUNTER — Other Ambulatory Visit: Payer: Self-pay | Admitting: Family Medicine

## 2018-09-08 ENCOUNTER — Other Ambulatory Visit: Payer: Self-pay | Admitting: Family Medicine

## 2018-09-08 DIAGNOSIS — I10 Essential (primary) hypertension: Secondary | ICD-10-CM

## 2018-12-10 ENCOUNTER — Other Ambulatory Visit: Payer: Self-pay | Admitting: Family Medicine

## 2018-12-18 ENCOUNTER — Encounter: Payer: Managed Care, Other (non HMO) | Admitting: Family Medicine

## 2018-12-22 ENCOUNTER — Ambulatory Visit (INDEPENDENT_AMBULATORY_CARE_PROVIDER_SITE_OTHER): Payer: BLUE CROSS/BLUE SHIELD | Admitting: Family Medicine

## 2018-12-22 ENCOUNTER — Other Ambulatory Visit: Payer: Self-pay

## 2018-12-22 ENCOUNTER — Encounter: Payer: Self-pay | Admitting: Family Medicine

## 2018-12-22 VITALS — BP 142/100 | HR 95 | Resp 15 | Ht 68.0 in | Wt 214.0 lb

## 2018-12-22 DIAGNOSIS — M79604 Pain in right leg: Secondary | ICD-10-CM

## 2018-12-22 DIAGNOSIS — M79605 Pain in left leg: Secondary | ICD-10-CM

## 2018-12-22 DIAGNOSIS — I10 Essential (primary) hypertension: Secondary | ICD-10-CM | POA: Diagnosis not present

## 2018-12-22 DIAGNOSIS — J302 Other seasonal allergic rhinitis: Secondary | ICD-10-CM | POA: Diagnosis not present

## 2018-12-22 DIAGNOSIS — Z0000 Encounter for general adult medical examination without abnormal findings: Secondary | ICD-10-CM | POA: Diagnosis not present

## 2018-12-22 DIAGNOSIS — Z23 Encounter for immunization: Secondary | ICD-10-CM

## 2018-12-22 DIAGNOSIS — M545 Low back pain, unspecified: Secondary | ICD-10-CM

## 2018-12-22 DIAGNOSIS — Z85528 Personal history of other malignant neoplasm of kidney: Secondary | ICD-10-CM

## 2018-12-22 DIAGNOSIS — R200 Anesthesia of skin: Secondary | ICD-10-CM

## 2018-12-22 DIAGNOSIS — R7303 Prediabetes: Secondary | ICD-10-CM | POA: Diagnosis not present

## 2018-12-22 DIAGNOSIS — E785 Hyperlipidemia, unspecified: Secondary | ICD-10-CM | POA: Diagnosis not present

## 2018-12-22 DIAGNOSIS — R202 Paresthesia of skin: Secondary | ICD-10-CM

## 2018-12-22 DIAGNOSIS — Z7189 Other specified counseling: Secondary | ICD-10-CM

## 2018-12-22 DIAGNOSIS — Z125 Encounter for screening for malignant neoplasm of prostate: Secondary | ICD-10-CM

## 2018-12-22 MED ORDER — FLUTICASONE PROPIONATE 50 MCG/ACT NA SUSP: 2.0000 | Freq: Every day | NASAL | 1 refills | Status: DC

## 2018-12-22 MED ORDER — LISINOPRIL 20 MG PO TABS: 20.0000 mg | ORAL_TABLET | Freq: Every day | ORAL | 1 refills | Status: DC

## 2018-12-22 MED ORDER — AMLODIPINE BESYLATE 10 MG PO TABS: 10.0000 mg | ORAL_TABLET | Freq: Every day | ORAL | 3 refills | Status: DC

## 2018-12-22 MED ORDER — AMLODIPINE BESYLATE 5 MG PO TABS: 5.0000 mg | ORAL_TABLET | Freq: Every day | ORAL | 1 refills | Status: DC

## 2018-12-22 NOTE — Assessment & Plan Note (Signed)
After obtaining informed consent, the vaccine is  administered , with no adverse effect noted at the time of administration.  

## 2018-12-22 NOTE — Progress Notes (Signed)
   Bobby Delacruz     MRN: [id number]      DOB: 12/21/1960   HPI: Patient is in for annual physical exam. Requests  Referral to pain management as he has not had this for over 2 years and has chronic back pain with disc disease , though I have not accessed the record Requests referral Back to [facility] where he had surgery for renal cell cancer years ago. Increased stress due to COVID 19 but overall doing fairly well Recent labs, if available are reviewed. Immunization is reviewed , and  updated if needed.    PE; BP (!) 142/100   Pulse 95   Resp 15   Ht 5\' 8"  (1.727 m)   Wt 214 lb (97.1 kg)   SpO2 96%   BMI 32.54 kg/m   Pleasant male, alert and oriented x 3, in no cardio-pulmonary distress. Afebrile. HEENT No facial trauma or asymetry. Sinuses non tender. EOMI, pupils equally reactive to light. External ears normal, tympanic membranes clear. Oropharynx moist, no exudate. Neck: supple, no adenopathy,JVD or thyromegaly.No bruits.  Chest: Clear to ascultation bilaterally.No crackles or wheezes. Non tender to palpation  Breast: No asymetry,no masses. No nipple discharge or inversion. No axillary or supraclavicular adenopathy  Cardiovascular system; Heart sounds normal,  S1 and  S2 ,no S3.  No murmur, or thrill. Apical beat not displaced Peripheral pulses normal.  Abdomen: Soft, non tender, no organomegaly or masses. No bruits. Bowel sounds normal. No guarding, tenderness or rebound.    Musculoskeletal exam: Decreased ROM of lumbar spine, adequate inhips , shoulders and knees. No deformity ,swelling or crepitus noted. No muscle wasting or atrophy.   Neurologic: Cranial nerves 2 to 12 intact. Power, tone ,sensation and reflexes normal throughout. No disturbance in gait. No tremor.  Skin: Intact, no ulceration, erythema , scaling or rash noted. Pigmentation normal throughout  Psych; Normal mood and affect. Judgement and concentration normal   Assessment  & Plan:  Annual physical exam Annual exam as documented. Counseling done  re healthy lifestyle involving commitment to 150 minutes exercise per week, heart healthy diet, and attaining healthy weight.The importance of adequate sleep also discussed. Regular seat belt use and home safety, is also discussed. Changes in health habits are decided on by the patient with goals and time frames  set for achieving them. Immunization and cancer screening needs are specifically addressed at this visit.   Need for shingles vaccine After obtaining informed consent, the vaccine is  administered , with no adverse effect noted at the time of administration.   Low back pain radiating to both legs Lost to chronic pain mangement , refer locally or soonest available, also c/o tingling in left index and thumb for years  H/O renal cell cancer Followed at [facility], lost to f/u because of insurance, refer back  HTN (hypertension) Uncontrolled , increase dose of amlodipine and re evall in 8 weeks DASH diet and commitment to daily physical activity for a minimum of 30 minutes discussed and encouraged, as a part of hypertension management. The importance of attaining a healthy weight is also discussed.  BP/Weight 12/22/2018 05/16/2017 09/04/2016 03/09/2016 08/19/2015 06/27/2015 2/35/5732  Systolic BP 202 542 706 237 628 315 176  Diastolic BP 160 92 98 82 84 90 84  Wt. (Lbs) 214 218.25 205 205 204.8 206 190  BMI 32.54 33.18 31.17 31.17 31.15 31.33 28.9

## 2018-12-22 NOTE — Assessment & Plan Note (Signed)

## 2018-12-22 NOTE — Assessment & Plan Note (Signed)
Followed at [facility], lost to f/u because of insurance, refer back

## 2018-12-22 NOTE — Patient Instructions (Addendum)
F/U in office in 8 to 10 weeks, needs BP re evaluation and shingrix # 2  Increase dose of amlodipine to 10 Shingrix #1 today  Info on being homebound is provided  IInfo on logging into my chart is provised   You are referred for pain management and evaluation of numbness to Dr Merlene Laughter, him office will call you  You are referred to [facility] hospital to f/u kidney cancer    Labs today, cBC, lipid, cmp and eGFr, pSA, and tSH and hJBA1C  Think about what you will eat, plan ahead. Choose " clean, green, fresh or frozen" over canned, processed or packaged foods which are more sugary, salty and fatty. 70 to 75% of food eaten should be vegetables and fruit. Three meals at set times with snacks allowed between meals, but they must be fruit or vegetables. Aim to eat over a 12 hour period , example 7 am to 7 pm, and STOP after  your last meal of the day. Drink water,generally about 64 ounces per day, no other drink is as healthy. Fruit juice is best enjoyed in a healthy way, by EATING the fruit.   It is important that you exercise regularly at least 30 minutes 5 times a week. If you develop chest pain, have severe difficulty breathing, or feel very tired, stop exercising immediately and seek medical attention    Thanks for choosing [facility], we consider it a privelige to serve you.

## 2018-12-22 NOTE — Assessment & Plan Note (Signed)
Lost to chronic pain mangement , refer locally or soonest available, also c/o tingling in left index and thumb for years

## 2018-12-23 LAB — CBC
HCT: 40.7 % (ref 38.5–50.0)
Hemoglobin: 13.5 g/dL (ref 13.2–17.1)
MCH: 26.2 pg — ABNORMAL LOW (ref 27.0–33.0)
MCHC: 33.2 g/dL (ref 32.0–36.0)
MCV: 78.9 fL — ABNORMAL LOW (ref 80.0–100.0)
MPV: 10.8 fL (ref 7.5–12.5)
Platelets: 282 10*3/uL (ref 140–400)
RBC: 5.16 10*6/uL (ref 4.20–5.80)
RDW: 14.5 % (ref 11.0–15.0)
WBC: 4.6 10*3/uL (ref 3.8–10.8)

## 2018-12-23 LAB — COMPLETE METABOLIC PANEL WITH GFR
AG Ratio: 1.4 (calc) (ref 1.0–2.5)
ALT: 24 U/L (ref 9–46)
AST: 15 U/L (ref 10–35)
Albumin: 4.2 g/dL (ref 3.6–5.1)
Alkaline phosphatase (APISO): 65 U/L (ref 35–144)
BUN: 15 mg/dL (ref 7–25)
CO2: 28 mmol/L (ref 20–32)
Calcium: 9.5 mg/dL (ref 8.6–10.3)
Chloride: 106 mmol/L (ref 98–110)
Creat: 1.3 mg/dL (ref 0.70–1.33)
GFR, Est African American: 71 mL/min/{1.73_m2} (ref >=60)
GFR, Est Non African American: 61 mL/min/{1.73_m2} (ref >=60)
Globulin: 2.9 g/dL (calc) (ref 1.9–3.7)
Glucose, Bld: 95 mg/dL (ref 65–99)
Potassium: 4.8 mmol/L (ref 3.5–5.3)
Sodium: 138 mmol/L (ref 135–146)
Total Bilirubin: 0.4 mg/dL (ref 0.2–1.2)
Total Protein: 7.1 g/dL (ref 6.1–8.1)

## 2018-12-23 LAB — LIPID PANEL
Cholesterol: 202 mg/dL — ABNORMAL HIGH (ref <200)
HDL: 39 mg/dL — ABNORMAL LOW (ref >=40)
LDL Cholesterol (Calc): 143 mg/dL (calc) — ABNORMAL HIGH
Non-HDL Cholesterol (Calc): 163 mg/dL (calc) — ABNORMAL HIGH (ref <130)
Total CHOL/HDL Ratio: 5.2 (calc) — ABNORMAL HIGH (ref <5.0)
Triglycerides: 92 mg/dL (ref <150)

## 2018-12-23 LAB — HEMOGLOBIN A1C
Hgb A1c MFr Bld: 6.1 % of total Hgb — ABNORMAL HIGH (ref <5.7)
Mean Plasma Glucose: 128 (calc)
eAG (mmol/L): 7.1 (calc)

## 2018-12-23 LAB — TSH: TSH: 1.06 mIU/L (ref 0.40–4.50)

## 2018-12-23 LAB — PSA: PSA: 1 ng/mL (ref <=4.0)

## 2018-12-27 ENCOUNTER — Encounter: Payer: Self-pay | Admitting: Family Medicine

## 2018-12-27 DIAGNOSIS — Z7189 Other specified counseling: Secondary | ICD-10-CM | POA: Insufficient documentation

## 2018-12-27 NOTE — Assessment & Plan Note (Signed)
Covid-19 Education  The signs and symptoms of of COVID -19 were discussed with the patient and how to seek care for testing. ( follow up with PCP or arrange  E-visit) The importance of social  distancing is discussed today.  

## 2018-12-27 NOTE — Assessment & Plan Note (Signed)
Uncontrolled , increase dose of amlodipine and re evall in 8 weeks DASH diet and commitment to daily physical activity for a minimum of 30 minutes discussed and encouraged, as a part of hypertension management. The importance of attaining a healthy weight is also discussed.  BP/Weight 12/22/2018 05/16/2017 09/04/2016 03/09/2016 08/19/2015 06/27/2015 2/89/7915  Systolic BP 041 364 383 779 396 886 484  Diastolic BP 720 92 98 82 84 90 84  Wt. (Lbs) 214 218.25 205 205 204.8 206 190  BMI 32.54 33.18 31.17 31.17 31.15 31.33 28.9

## 2019-02-16 ENCOUNTER — Ambulatory Visit: Payer: BLUE CROSS/BLUE SHIELD | Admitting: Family Medicine

## 2019-02-17 ENCOUNTER — Ambulatory Visit: Payer: BLUE CROSS/BLUE SHIELD | Admitting: Family Medicine

## 2019-09-18 ENCOUNTER — Other Ambulatory Visit: Payer: Self-pay | Admitting: Family Medicine

## 2019-09-18 DIAGNOSIS — I10 Essential (primary) hypertension: Secondary | ICD-10-CM

## 2019-10-02 ENCOUNTER — Other Ambulatory Visit: Payer: Self-pay | Admitting: Family Medicine

## 2019-10-02 DIAGNOSIS — I10 Essential (primary) hypertension: Secondary | ICD-10-CM

## 2019-10-11 ENCOUNTER — Other Ambulatory Visit: Payer: Self-pay | Admitting: Family Medicine

## 2019-10-11 DIAGNOSIS — I10 Essential (primary) hypertension: Secondary | ICD-10-CM

## 2019-10-12 ENCOUNTER — Other Ambulatory Visit: Payer: Self-pay | Admitting: *Deleted

## 2019-10-12 MED ORDER — AMLODIPINE BESYLATE 10 MG PO TABS: 10.0000 mg | ORAL_TABLET | Freq: Every day | ORAL | 3 refills | Status: DC

## 2019-10-14 ENCOUNTER — Other Ambulatory Visit: Payer: Self-pay | Admitting: *Deleted

## 2019-11-02 ENCOUNTER — Other Ambulatory Visit: Payer: Self-pay | Admitting: Family Medicine

## 2019-11-02 DIAGNOSIS — I10 Essential (primary) hypertension: Secondary | ICD-10-CM

## 2019-11-25 ENCOUNTER — Other Ambulatory Visit: Payer: Self-pay | Admitting: Family Medicine

## 2019-11-25 DIAGNOSIS — I10 Essential (primary) hypertension: Secondary | ICD-10-CM

## 2020-01-06 ENCOUNTER — Other Ambulatory Visit: Payer: Self-pay | Admitting: Family Medicine

## 2020-07-12 ENCOUNTER — Other Ambulatory Visit: Payer: Self-pay | Admitting: Family Medicine

## 2020-07-12 DIAGNOSIS — I10 Essential (primary) hypertension: Secondary | ICD-10-CM

## 2020-08-23 ENCOUNTER — Other Ambulatory Visit: Payer: Self-pay

## 2020-08-23 ENCOUNTER — Ambulatory Visit: Payer: Self-pay | Admitting: Nurse Practitioner

## 2020-08-23 ENCOUNTER — Encounter: Payer: Self-pay | Admitting: Nurse Practitioner

## 2020-08-23 VITALS — BP 150/98 | HR 90 | Temp 98.5°F | Resp 18 | Ht 68.0 in | Wt 220.0 lb

## 2020-08-23 DIAGNOSIS — Z0000 Encounter for general adult medical examination without abnormal findings: Secondary | ICD-10-CM

## 2020-08-23 DIAGNOSIS — J302 Other seasonal allergic rhinitis: Secondary | ICD-10-CM

## 2020-08-23 DIAGNOSIS — R7303 Prediabetes: Secondary | ICD-10-CM

## 2020-08-23 DIAGNOSIS — I10 Essential (primary) hypertension: Secondary | ICD-10-CM

## 2020-08-23 DIAGNOSIS — M545 Low back pain, unspecified: Secondary | ICD-10-CM

## 2020-08-23 DIAGNOSIS — M79604 Pain in right leg: Secondary | ICD-10-CM

## 2020-08-23 DIAGNOSIS — M79605 Pain in left leg: Secondary | ICD-10-CM

## 2020-08-23 MED ORDER — AMLODIPINE BESYLATE 10 MG PO TABS: 10.0000 mg | ORAL_TABLET | Freq: Every day | ORAL | 1 refills | Status: DC

## 2020-08-23 MED ORDER — CYCLOBENZAPRINE HCL 10 MG PO TABS: 10.0000 mg | ORAL_TABLET | Freq: Three times a day (TID) | ORAL | 1 refills | Status: DC | PRN

## 2020-08-23 MED ORDER — FLUTICASONE PROPIONATE 50 MCG/ACT NA SUSP: 1.0000 | Freq: Every day | NASAL | 1 refills | Status: DC

## 2020-08-23 NOTE — Patient Instructions (Addendum)
I refilled your medications today. You have not had recent labs. We will get perform a physical exam in about a month. Please get labs 2-3 days prior to your visit.

## 2020-08-23 NOTE — Assessment & Plan Note (Addendum)
-  has been an ongoing issue -will refer to pain management -he travels for work, in [employer], and that makes stable pain mgmt difficult -refilled flexeril

## 2020-08-23 NOTE — Assessment & Plan Note (Signed)
-  no issues today -refilled flonase

## 2020-08-23 NOTE — Progress Notes (Signed)
 Acute Office Visit  Subjective:    Patient ID: Bobby Delacruz, male    DOB: 02/25/1961, 60 y.o.   MRN: [id number]  Chief Complaint  Patient presents with  . Follow-up  . Hypertension    HPI Patient is in today for follow-up and med refills. He state he needs refills on all meds.  He has chronic pain and was referred to pain management for chronic low back pain. He states he missed his last appointment there d/t his work related travel.  He saw Dr. Rainwater previously, but pt states he is no longer in pain management.  He also travels for business and established care with Dr. Sheller in [place] while he was working.  Past Medical History:  Diagnosis Date  . Cancer (HCC)   . Enlarged heart   . Hypertension 2009  . Kidney cancer, primary, with metastasis from kidney to other site (HCC) 2011, left   partial nephrectomy , cured  . Migraine     Past Surgical History:  Procedure Laterality Date  . LAPAROSCOPIC NEPHRECTOMY Left 2011   [facility]     Family History  Problem Relation Age of Onset  . Hypertension Mother   . Hypertension Father   . Heart murmur Father   . Hypertension Sister     Social History   Socioeconomic History  . Marital status: Widowed    Spouse name: Not on file  . Number of children: Not on file  . Years of education: Not on file  . Highest education level: Not on file  Occupational History  . Not on file  Tobacco Use  . Smoking status: Former Smoker    Quit date: 09/03/2002    Years since quitting: 17.9  . Smokeless tobacco: Never Used  Substance and Sexual Activity  . Alcohol use: No    Comment: former   . Drug use: No  . Sexual activity: Yes  Other Topics Concern  . Not on file  Social History Narrative  . Not on file   Social Determinants of Health   Financial Resource Strain: Not on file  Food Insecurity: Not on file  Transportation Needs: Not on file  Physical Activity: Not on file  Stress: Not on file  Social  Connections: Not on file  Intimate Partner Violence: Not on file    Outpatient Medications Prior to Visit  Medication Sig Dispense Refill  . fish oil-omega-3 fatty acids 1000 MG capsule Take 1 g by mouth every morning.     . lisinopril (ZESTRIL) 20 MG tablet TAKE 1 TABLET BY MOUTH EVERY DAY 30 tablet 5  . Lysine HCl 500 MG TABS Take 1,000 mg by mouth every morning.    . Multiple Vitamin (MULTIVITAMIN WITH MINERALS) TABS Take 1 tablet by mouth every morning.     . oxyCODONE-acetaminophen (PERCOCET) 7.5-325 MG tablet Take 1 tablet by mouth every 8 (eight) hours as needed for moderate pain or severe pain.     . vitamin C (ASCORBIC ACID) 500 MG tablet Take 500 mg by mouth every morning.     . amLODipine (NORVASC) 10 MG tablet TAKE 1 TABLET BY MOUTH EVERY DAY 90 tablet 1  . fluticasone (FLONASE) 50 MCG/ACT nasal spray Place 2 sprays into both nostrils daily. 48 g 1   No facility-administered medications prior to visit.    Allergies  Allergen Reactions  . Other Hives and Itching    Shrimp - hives and itching throat     Review of Systems    Constitutional: Negative.   Respiratory: Negative.   Cardiovascular: Negative.   Musculoskeletal: Positive for back pain.       Has long history of sciatica and lower back pain; has numbness to fingers of left hand  Neurological: Positive for numbness.       Objective:    Physical Exam Constitutional:      Appearance: Normal appearance.  Cardiovascular:     Rate and Rhythm: Normal rate and regular rhythm.     Pulses: Normal pulses.     Heart sounds: Normal heart sounds.  Pulmonary:     Effort: Pulmonary effort is normal.     Breath sounds: Normal breath sounds.  Musculoskeletal:        General: Tenderness present. No swelling or deformity.     Comments: Tender to palpation to lower back and paraspinal muscles of both sides of lower back  Neurological:     Mental Status: He is alert.     BP (!) 150/98   Pulse 90   Temp 98.5 F (36.9  C)   Resp 18   Ht 5' 8" (1.727 m)   Wt 220 lb (99.8 kg)   SpO2 95%   BMI 33.45 kg/m  Wt Readings from Last 3 Encounters:  08/23/20 220 lb (99.8 kg)  12/22/18 214 lb (97.1 kg)  05/16/17 218 lb 4 oz (99 kg)    Health Maintenance Due  Topic Date Due  . TETANUS/TDAP  08/13/2017  . COLONOSCOPY (Pts 45-68yr Insurance coverage will need to be confirmed)  08/14/2019  . INFLUENZA VACCINE  03/13/2020  . COVID-19 Vaccine (2 - Moderna risk 4-dose series) 03/17/2020    There are no preventive care reminders to display for this patient.   Lab Results  Component Value Date   TSH 1.06 12/22/2018   Lab Results  Component Value Date   WBC 4.6 12/22/2018   HGB 13.5 12/22/2018   HCT 40.7 12/22/2018   MCV 78.9 (L) 12/22/2018   PLT 282 12/22/2018   Lab Results  Component Value Date   NA 138 12/22/2018   K 4.8 12/22/2018   CO2 28 12/22/2018   GLUCOSE 95 12/22/2018   BUN 15 12/22/2018   CREATININE 1.30 12/22/2018   BILITOT 0.4 12/22/2018   ALKPHOS 68 11/10/2015   AST 15 12/22/2018   ALT 24 12/22/2018   PROT 7.1 12/22/2018   ALBUMIN 4.1 11/10/2015   CALCIUM 9.5 12/22/2018   Lab Results  Component Value Date   CHOL 202 (H) 12/22/2018   Lab Results  Component Value Date   HDL 39 (L) 12/22/2018   Lab Results  Component Value Date   LDLCALC 143 (H) 12/22/2018   Lab Results  Component Value Date   TRIG 92 12/22/2018   Lab Results  Component Value Date   CHOLHDL 5.2 (H) 12/22/2018   Lab Results  Component Value Date   HGBA1C 6.1 (H) 12/22/2018       Assessment & Plan:   Problem List Items Addressed This Visit      Cardiovascular and Mediastinum   HTN (hypertension)    -ran out of amlodipine 2 weeks ago -refilled today -BP slightly elevated today, but should resolved when meds restarted -he states his lisinopril was filled recently      Relevant Medications   amLODipine (NORVASC) 10 MG tablet   Other Relevant Orders   CBC with Differential/Platelet    CMP14+EGFR   Lipid Panel With LDL/HDL Ratio     Respiratory   Allergic rhinitis    -  no issues today -refilled flonase      Relevant Medications   fluticasone (FLONASE) 50 MCG/ACT nasal spray     Other   Prediabetes - Primary   Relevant Orders   Hemoglobin A1c   Low back pain radiating to both legs    -has been an ongoing issue -will refer to pain management -he travels for work, in [employer] liquidation, and that makes stable pain mgmt difficult -refilled flexeril      Relevant Medications   cyclobenzaprine (FLEXERIL) 10 MG tablet   Other Relevant Orders   Ambulatory referral to Pain Clinic    Other Visit Diagnoses    Preventative health care       Relevant Orders   CBC with Differential/Platelet   CMP14+EGFR   Hemoglobin A1c   Lipid Panel With LDL/HDL Ratio       Meds ordered this encounter  Medications  . fluticasone (FLONASE) 50 MCG/ACT nasal spray    Sig: Place 1 spray into both nostrils daily.    Dispense:  16 g    Refill:  1  . amLODipine (NORVASC) 10 MG tablet    Sig: Take 1 tablet (10 mg total) by mouth daily.    Dispense:  90 tablet    Refill:  1  . cyclobenzaprine (FLEXERIL) 10 MG tablet    Sig: Take 1 tablet (10 mg total) by mouth 3 (three) times daily as needed for muscle spasms.    Dispense:  270 tablet    Refill:  1     JOSEPH M GRAY, NP 

## 2020-08-23 NOTE — Assessment & Plan Note (Signed)
-  ran out of amlodipine 2 weeks ago -refilled today -BP slightly elevated today, but should resolved when meds restarted -he states his lisinopril was filled recently

## 2020-08-25 ENCOUNTER — Other Ambulatory Visit: Payer: Self-pay | Admitting: Family Medicine

## 2020-08-25 DIAGNOSIS — I10 Essential (primary) hypertension: Secondary | ICD-10-CM

## 2020-09-14 ENCOUNTER — Other Ambulatory Visit: Payer: Self-pay | Admitting: Nurse Practitioner

## 2020-09-14 DIAGNOSIS — J302 Other seasonal allergic rhinitis: Secondary | ICD-10-CM

## 2020-09-20 ENCOUNTER — Encounter: Payer: Self-pay | Admitting: Nurse Practitioner

## 2020-10-03 ENCOUNTER — Ambulatory Visit: Payer: BLUE CROSS/BLUE SHIELD | Admitting: Family Medicine

## 2020-11-07 ENCOUNTER — Other Ambulatory Visit: Payer: Self-pay | Admitting: Family Medicine

## 2020-11-07 DIAGNOSIS — I10 Essential (primary) hypertension: Secondary | ICD-10-CM

## 2020-11-24 ENCOUNTER — Other Ambulatory Visit: Payer: Self-pay | Admitting: Family Medicine

## 2020-11-24 DIAGNOSIS — I10 Essential (primary) hypertension: Secondary | ICD-10-CM

## 2020-11-24 MED ORDER — LISINOPRIL 20 MG PO TABS: 1.0000 | ORAL_TABLET | Freq: Every day | ORAL | 0 refills | Status: DC

## 2021-02-20 ENCOUNTER — Other Ambulatory Visit: Payer: Self-pay | Admitting: Nurse Practitioner

## 2021-02-20 DIAGNOSIS — I10 Essential (primary) hypertension: Secondary | ICD-10-CM

## 2021-03-01 ENCOUNTER — Ambulatory Visit (INDEPENDENT_AMBULATORY_CARE_PROVIDER_SITE_OTHER): Payer: Managed Care, Other (non HMO) | Admitting: Family Medicine

## 2021-03-01 ENCOUNTER — Encounter: Payer: Self-pay | Admitting: Family Medicine

## 2021-03-01 ENCOUNTER — Other Ambulatory Visit: Payer: Self-pay

## 2021-03-01 VITALS — BP 130/92 | HR 82 | Temp 98.5°F | Resp 20 | Ht 68.0 in | Wt 212.0 lb

## 2021-03-01 DIAGNOSIS — E785 Hyperlipidemia, unspecified: Secondary | ICD-10-CM

## 2021-03-01 DIAGNOSIS — M5410 Radiculopathy, site unspecified: Secondary | ICD-10-CM

## 2021-03-01 DIAGNOSIS — Z23 Encounter for immunization: Secondary | ICD-10-CM | POA: Diagnosis not present

## 2021-03-01 DIAGNOSIS — M542 Cervicalgia: Secondary | ICD-10-CM | POA: Diagnosis not present

## 2021-03-01 DIAGNOSIS — I10 Essential (primary) hypertension: Secondary | ICD-10-CM | POA: Diagnosis not present

## 2021-03-01 DIAGNOSIS — E669 Obesity, unspecified: Secondary | ICD-10-CM

## 2021-03-01 DIAGNOSIS — E559 Vitamin D deficiency, unspecified: Secondary | ICD-10-CM

## 2021-03-01 DIAGNOSIS — Z125 Encounter for screening for malignant neoplasm of prostate: Secondary | ICD-10-CM

## 2021-03-01 DIAGNOSIS — R7303 Prediabetes: Secondary | ICD-10-CM

## 2021-03-01 MED ORDER — PREGABALIN 25 MG PO CAPS: 25.0000 mg | ORAL_CAPSULE | Freq: Two times a day (BID) | ORAL | 2 refills | Status: AC

## 2021-03-01 MED ORDER — IBUPROFEN 800 MG PO TABS: 800.0000 mg | ORAL_TABLET | Freq: Three times a day (TID) | ORAL | 0 refills | Status: AC | PRN

## 2021-03-01 MED ORDER — PREDNISONE 5 MG (21) PO TBPK
5.0000 mg | ORAL_TABLET | ORAL | 0 refills | Status: AC
Start: 2021-03-01 — End: ?

## 2021-03-01 MED ORDER — FAMOTIDINE 40 MG PO TABS: 40.0000 mg | ORAL_TABLET | Freq: Every day | ORAL | 0 refills | Status: DC

## 2021-03-01 NOTE — Patient Instructions (Addendum)
F/U as before, call if you need me sooner  Labs today CBC, lipid, cmp and EGFR, tSH, hBA1C , PSA and vit D  Shingrix #1 today, 2nd vaccine in 2 to 4 months    You will be referred to pain clinic of your choice re chronic back pain , states wants same provider as spouse  X ray of neck today  Ibuprofen and prednisone ar e prescribed short term for pain, and lyrica long term. Pepcid is prescribed short term to coat stomach on the anti inflammatories  Thanks for choosing [facility], we consider it a privelige to serve you.

## 2021-03-02 ENCOUNTER — Ambulatory Visit: Payer: Managed Care, Other (non HMO) | Admitting: Family Medicine

## 2021-03-03 LAB — CMP14+EGFR
ALT: 24 IU/L (ref 0–44)
AST: 20 IU/L (ref 0–40)
Albumin/Globulin Ratio: 1.3 (ref 1.2–2.2)
Albumin: 4.3 g/dL (ref 3.8–4.9)
Alkaline Phosphatase: 80 IU/L (ref 44–121)
BUN/Creatinine Ratio: 13 (ref 9–20)
BUN: 20 mg/dL (ref 6–24)
Bilirubin Total: <0.2 mg/dL (ref 0.0–1.2)
CO2: 22 mmol/L (ref 20–29)
Calcium: 9.5 mg/dL (ref 8.7–10.2)
Chloride: 106 mmol/L (ref 96–106)
Creatinine, Ser: 1.59 mg/dL — ABNORMAL HIGH (ref 0.76–1.27)
Globulin, Total: 3.2 g/dL (ref 1.5–4.5)
Glucose: 80 mg/dL (ref 65–99)
Potassium: 4.6 mmol/L (ref 3.5–5.2)
Sodium: 141 mmol/L (ref 134–144)
Total Protein: 7.5 g/dL (ref 6.0–8.5)
eGFR: 50 mL/min/{1.73_m2} — ABNORMAL LOW (ref >59)

## 2021-03-03 LAB — LIPID PANEL
Chol/HDL Ratio: 4.4 ratio (ref 0.0–5.0)
Cholesterol, Total: 167 mg/dL (ref 100–199)
HDL: 38 mg/dL — ABNORMAL LOW (ref >39)
LDL Chol Calc (NIH): 105 mg/dL — ABNORMAL HIGH (ref 0–99)
Triglycerides: 135 mg/dL (ref 0–149)
VLDL Cholesterol Cal: 24 mg/dL (ref 5–40)

## 2021-03-03 LAB — CBC
Hematocrit: 42.2 % (ref 37.5–51.0)
Hemoglobin: 13.2 g/dL (ref 13.0–17.7)
MCH: 25.8 pg — ABNORMAL LOW (ref 26.6–33.0)
MCHC: 31.3 g/dL — ABNORMAL LOW (ref 31.5–35.7)
MCV: 82 fL (ref 79–97)
Platelets: 265 10*3/uL (ref 150–450)
RBC: 5.12 x10E6/uL (ref 4.14–5.80)
RDW: 14.4 % (ref 11.6–15.4)
WBC: 7.1 10*3/uL (ref 3.4–10.8)

## 2021-03-03 LAB — HEMOGLOBIN A1C
Est. average glucose Bld gHb Est-mCnc: 131 mg/dL
Hgb A1c MFr Bld: 6.2 % — ABNORMAL HIGH (ref 4.8–5.6)

## 2021-03-03 LAB — TSH: TSH: 1.4 u[IU]/mL (ref 0.450–4.500)

## 2021-03-03 LAB — PSA: Prostate Specific Ag, Serum: 1.7 ng/mL (ref 0.0–4.0)

## 2021-03-03 LAB — VITAMIN D 25 HYDROXY (VIT D DEFICIENCY, FRACTURES): Vit D, 25-Hydroxy: 12.7 ng/mL — ABNORMAL LOW (ref 30.0–100.0)

## 2021-03-05 ENCOUNTER — Encounter: Payer: Self-pay | Admitting: Family Medicine

## 2021-03-05 MED ORDER — ERGOCALCIFEROL 1.25 MG (50000 UT) PO CAPS: 50000.0000 [IU] | ORAL_CAPSULE | ORAL | 2 refills | Status: AC

## 2021-03-05 NOTE — Assessment & Plan Note (Signed)
Uncontrolled.Toradol and depo medrol administered IM in the office , to be followed by a short course of oral prednisone and NSAIDS. Start loe dose lyrics, update x ray lumbar

## 2021-03-05 NOTE — Progress Notes (Signed)
It d   Bobby Delacruz     MRN: [id number]      DOB: 09-Jun-1961   HPI Bobby Delacruz is here for follow up and re-evaluation of chronic medical conditions, medication management and review of any available recent lab and radiology data.  Preventive health is updated, specifically  Cancer screening and Immunization.   Questions or concerns regarding consultations or procedures which the PT has had in the interim are  addressed. The PT denies any adverse reactions to current medications since the last visit.   C/o 6 month h/o low back pain radiating to right buttocks which is worsening, denies any new numbness or weakness or incontinence C/o left arm numbness Needs to re establish with pain clinic  ROS Denies recent fever or chills. Denies sinus pressure, nasal congestion, ear pain or sore throat. Denies chest congestion, productive cough or wheezing. Denies chest pains, palpitations and leg swelling Denies abdominal pain, nausea, vomiting,diarrhea or constipation.   Denies dysuria, frequency, hesitancy or incontinence. . Denies headaches, seizures,  Denies depression, anxiety or insomnia. Denies skin break down or rash.   PE  BP (!) 130/92   Pulse 82   Temp 98.5 F (36.9 C)   Resp 20   Ht '5\' 8"'$  (1.727 m)   Wt 212 lb (96.2 kg)   SpO2 97%   BMI 32.23 kg/m   Patient alert and oriented and in no cardiopulmonary distress.pt in pain  HEENT: No facial asymmetry, EOMI,     Neck supple .  Chest: Clear to auscultation bilaterally.  CVS: S1, S2 no murmurs, no S3.Regular rate.  ABD: Soft non tender.   Ext: No edema  [id number]  ROM lumbar spine, adequate in shoulders, hips and knees.  Skin: Intact, no ulcerations or rash noted.  Psych: Good eye contact, normal affect. Memory intact not anxious or depressed appearing.  CNS: CN 2-12 intact, power,  normal throughout.no focal deficits noted.   Assessment & Plan  HTN (hypertension) Elevated at visit, pt in pain will  reassess DASH diet and commitment to daily physical activity for a minimum of 30 minutes discussed and encouraged, as a part of hypertension management. The importance of attaining a healthy weight is also discussed.  BP/Weight 03/01/2021 08/23/2020 12/22/2018 05/16/2017 09/04/2016 [id number]  Systolic BP [id number] Q000111Q [id number] [id number]  Diastolic BP 92 98 [id number] 92 98 82 84  Wt. (Lbs) 212 220 214 218.25 205 205 204.8  BMI 32.23 33.45 32.54 33.18 31.17 31.17 31.15       Back pain with right-sided radiculopathy Uncontrolled.Toradol and depo medrol administered IM in the office , to be followed by a short course of oral prednisone and NSAIDS. Start loe dose lyrics, update x ray lumbar  Need for shingles vaccine After obtaining informed consent, the vaccine is  administered , with no adverse effect noted at the time of administration.   Obesity (BMI 30.0-34.9)  Patient re-educated about  the importance of commitment to a  minimum of 150 minutes of exercise per week as able.  The importance of healthy food choices with portion control discussed, as well as eating regularly and within a 12 hour window most days. The need to choose "clean , green" food 50 to 75% of the time is discussed, as well as to make water the primary drink and set a goal of 64 ounces water daily.    Weight /BMI 03/01/2021 08/23/2020 12/22/2018  WEIGHT 212 lb 220 lb 214 lb  HEIGHT '5\' 8"'$   $'5\' 8"'s$  '5\' 8"'$   BMI 32.23 kg/m2 33.45 kg/m2 32.54 kg/m2      Prediabetes Patient educated about the importance of limiting  Carbohydrate intake , the need to commit to daily physical activity for a minimum of 30 minutes , and to commit weight loss. The fact that changes in all these areas will reduce or eliminate all together the development of diabetes is stressed.  Deteriorated , needs to reduce carb intake  Diabetic Labs Latest Ref Rng & Units 03/02/2021 12/22/2018 05/16/2017 11/10/2015 08/19/2015  HbA1c 4.8 - 5.6 % 6.2(H) 6.1(H) 5.9(H)  5.7(H) -  Chol 100 - 199 mg/dL 167 202(H) 196 - -  HDL >39 mg/dL 38(L) 39(L) 36(L) - -  Calc LDL 0 - 99 mg/dL 105(H) 143(H) 135(H) - -  Triglycerides 0 - 149 mg/dL 135 92 127 - -  Creatinine 0.76 - 1.27 mg/dL 1.59(H) 1.30 1.19 1.00 1.36(H)   BP/Weight 03/01/2021 08/23/2020 12/22/2018 05/16/2017 09/04/2016 [id number]  Systolic BP [id number] Q000111Q A999333 [id number] 123456 [id number]  Diastolic BP 92 98 [id number] 92 98 82 84  Wt. (Lbs) 212 220 214 218.25 205 205 204.8  BMI 32.23 33.45 32.54 33.18 31.17 31.17 31.15   No flowsheet data found.    Dyslipidemia Hyperlipidemia:Low fat diet discussed and encouraged.   Lipid Panel  Lab Results  Component Value Date   CHOL 167 03/02/2021   HDL 38 (L) 03/02/2021   LDLCALC 105 (H) 03/02/2021   TRIG 135 03/02/2021   CHOLHDL 4.4 03/02/2021   Needs to reduce fat intake and inc exercise

## 2021-03-05 NOTE — Assessment & Plan Note (Signed)
Patient educated about the importance of limiting  Carbohydrate intake , the need to commit to daily physical activity for a minimum of 30 minutes , and to commit weight loss. The fact that changes in all these areas will reduce or eliminate all together the development of diabetes is stressed.  Deteriorated , needs to reduce carb intake  Diabetic Labs Latest Ref Rng & Units 03/02/2021 12/22/2018 05/16/2017 11/10/2015 08/19/2015  HbA1c 4.8 - 5.6 % 6.2(H) 6.1(H) 5.9(H) 5.7(H) -  Chol 100 - 199 mg/dL 167 202(H) 196 - -  HDL >39 mg/dL 38(L) 39(L) 36(L) - -  Calc LDL 0 - 99 mg/dL 105(H) 143(H) 135(H) - -  Triglycerides 0 - 149 mg/dL 135 92 127 - -  Creatinine 0.76 - 1.27 mg/dL 1.59(H) 1.30 1.19 1.00 1.36(H)   BP/Weight 03/01/2021 08/23/2020 12/22/2018 05/16/2017 09/04/2016 [id number]  Systolic BP [id number] Q000111Q A999333 [id number] 123456 [id number]  Diastolic BP 92 98 [id number] 92 98 82 84  Wt. (Lbs) 212 220 214 218.25 205 205 204.8  BMI 32.23 33.45 32.54 33.18 31.17 31.17 31.15   No flowsheet data found.

## 2021-03-05 NOTE — Assessment & Plan Note (Signed)
Elevated at visit, pt in pain will reassess DASH diet and commitment to daily physical activity for a minimum of 30 minutes discussed and encouraged, as a part of hypertension management. The importance of attaining a healthy weight is also discussed.  BP/Weight 03/01/2021 08/23/2020 12/22/2018 05/16/2017 09/04/2016 [id number]  Systolic BP [id number] Q000111Q A999333 [id number] 123456 [id number]  Diastolic BP 92 98 [id number] 92 98 82 84  Wt. (Lbs) 212 220 214 218.25 205 205 204.8  BMI 32.23 33.45 32.54 33.18 31.17 31.17 31.15

## 2021-03-05 NOTE — Assessment & Plan Note (Signed)
  Patient re-educated about  the importance of commitment to a  minimum of 150 minutes of exercise per week as able.  The importance of healthy food choices with portion control discussed, as well as eating regularly and within a 12 hour window most days. The need to choose "clean , green" food 50 to 75% of the time is discussed, as well as to make water the primary drink and set a goal of 64 ounces water daily.    Weight /BMI 03/01/2021 08/23/2020 12/22/2018  WEIGHT 212 lb 220 lb 214 lb  HEIGHT '5\' 8"'$  '5\' 8"'$  '5\' 8"'$   BMI 32.23 kg/m2 33.45 kg/m2 32.54 kg/m2

## 2021-03-05 NOTE — Assessment & Plan Note (Signed)
Hyperlipidemia:Low fat diet discussed and encouraged.   Lipid Panel  Lab Results  Component Value Date   CHOL 167 03/02/2021   HDL 38 (L) 03/02/2021   LDLCALC 105 (H) 03/02/2021   TRIG 135 03/02/2021   CHOLHDL 4.4 03/02/2021   Needs to reduce fat intake and inc exercise

## 2021-03-05 NOTE — Assessment & Plan Note (Signed)
After obtaining informed consent, the vaccine is  administered , with no adverse effect noted at the time of administration.  

## 2021-03-07 ENCOUNTER — Other Ambulatory Visit: Payer: Self-pay

## 2021-03-07 ENCOUNTER — Ambulatory Visit (HOSPITAL_COMMUNITY)
Admission: RE | Admit: 2021-03-07 | Discharge: 2021-03-07 | Disposition: A | Discharge status: Home / Self Care | Payer: Managed Care, Other (non HMO) | Source: Ambulatory Visit | Attending: Family Medicine | Admitting: Family Medicine

## 2021-03-07 DIAGNOSIS — M542 Cervicalgia: Secondary | ICD-10-CM | POA: Diagnosis present

## 2021-03-07 DIAGNOSIS — M5410 Radiculopathy, site unspecified: Secondary | ICD-10-CM | POA: Insufficient documentation

## 2021-03-09 ENCOUNTER — Other Ambulatory Visit: Payer: Self-pay | Admitting: Family Medicine

## 2021-03-09 DIAGNOSIS — G8929 Other chronic pain: Secondary | ICD-10-CM

## 2021-03-09 DIAGNOSIS — M542 Cervicalgia: Secondary | ICD-10-CM

## 2021-03-09 DIAGNOSIS — M5442 Lumbago with sciatica, left side: Secondary | ICD-10-CM

## 2021-03-10 ENCOUNTER — Telehealth: Payer: Self-pay

## 2021-03-10 NOTE — Telephone Encounter (Signed)
Pt aware that MRIs are being precerted and will call back when they are authorized

## 2021-03-10 NOTE — Telephone Encounter (Signed)
Patient returning call. Please return call # [phone].

## 2021-03-15 ENCOUNTER — Telehealth: Payer: Self-pay

## 2021-03-15 NOTE — Telephone Encounter (Signed)
The cervical and lumbar MRI was denied by insurance. I put the paperwork in your box. He needs 6 weeks of provider directed treatment and a follow up (physical therapy, meds, etc)

## 2021-03-15 NOTE — Telephone Encounter (Signed)
He is aware the MRIs were denied and he will find out the name of the pain specialist his wife sees and call me back to get a referral there

## 2021-04-04 ENCOUNTER — Other Ambulatory Visit: Payer: Self-pay | Admitting: Family Medicine

## 2021-04-22 ENCOUNTER — Other Ambulatory Visit: Payer: Self-pay | Admitting: Family Medicine

## 2021-05-02 ENCOUNTER — Encounter: Payer: Managed Care, Other (non HMO) | Admitting: Family Medicine

## 2021-05-21 ENCOUNTER — Other Ambulatory Visit: Payer: Self-pay | Admitting: Family Medicine

## 2021-05-21 DIAGNOSIS — I10 Essential (primary) hypertension: Secondary | ICD-10-CM

## 2021-08-19 ENCOUNTER — Other Ambulatory Visit: Payer: Self-pay | Admitting: Family Medicine

## 2021-08-19 DIAGNOSIS — I10 Essential (primary) hypertension: Secondary | ICD-10-CM

## 2021-11-20 ENCOUNTER — Other Ambulatory Visit: Payer: Self-pay | Admitting: Family Medicine

## 2021-11-20 DIAGNOSIS — I10 Essential (primary) hypertension: Secondary | ICD-10-CM

## 2022-05-30 ENCOUNTER — Other Ambulatory Visit: Payer: Self-pay | Admitting: Family Medicine

## 2022-05-30 DIAGNOSIS — I10 Essential (primary) hypertension: Secondary | ICD-10-CM

## 2022-05-30 MED ORDER — LISINOPRIL 20 MG PO TABS: 20.0000 mg | ORAL_TABLET | Freq: Every day | ORAL | 0 refills | Status: DC

## 2022-09-01 ENCOUNTER — Other Ambulatory Visit: Payer: Self-pay | Admitting: Family Medicine

## 2022-09-01 DIAGNOSIS — I10 Essential (primary) hypertension: Secondary | ICD-10-CM

## 2022-12-12 ENCOUNTER — Encounter: Payer: Self-pay | Admitting: Family Medicine

## 2022-12-12 ENCOUNTER — Telehealth: Payer: Self-pay | Admitting: Family Medicine

## 2022-12-12 ENCOUNTER — Other Ambulatory Visit: Payer: Self-pay

## 2022-12-12 DIAGNOSIS — I10 Essential (primary) hypertension: Secondary | ICD-10-CM

## 2022-12-12 MED ORDER — LISINOPRIL 20 MG PO TABS: 20.0000 mg | ORAL_TABLET | Freq: Every day | ORAL | 0 refills | Status: DC

## 2022-12-12 MED ORDER — AMLODIPINE BESYLATE 10 MG PO TABS
10.0000 mg | ORAL_TABLET | Freq: Every day | ORAL | 2 refills | Status: AC
Start: 2022-12-12 — End: ?

## 2022-12-12 NOTE — Telephone Encounter (Signed)
Prescription Request  12/12/2022  LOV: Visit date not found  What is the name of the medication or equipment? amLODipine (NORVASC) 10 MG tablet   lisinopril (ZESTRIL) 20 MG tablet   Have you contacted your pharmacy to request a refill? No   Which pharmacy would you like this sent to?    [facility] Address: [address] [place], [place] [address] Hours:  Closed ? Opens 10?AM Phone: [phone]  Patient notified that their request is being sent to the clinical staff for review and that they should receive a response within 2 business days.   Please advise at Mobile [phone] (mobile)   Pt is out of town for work

## 2022-12-12 NOTE — Telephone Encounter (Signed)
Medication sent and lvm letting patient know

## 2022-12-12 NOTE — Telephone Encounter (Signed)
Refills sent per patient request.

## 2023-01-21 ENCOUNTER — Telehealth: Payer: Self-pay | Admitting: Family Medicine

## 2023-01-21 NOTE — Telephone Encounter (Signed)
I note that refills have been sent under my name for amlodipine for 9 months, and lisinopril 20 mg for 3 months on May1, 2024. I attempted to contact the pt to make him aware that refills will be cancelled as HE HAS NOT BEEN IN THE OFFICE  since July 2022 I was unable to either speak with him or leave a message . PLEASE cancel the refills that have been sent for the amlodipine, and mail him a letter making him aware of the decision, and the IMPORTANCE of regular medical visits including labs for his safety and appropriate health care VERY IMPORTANT that this is done ASAP ?? Please ask Please send me a message to verify completion ( I gather he no longer lives in [place], so making an appointment with me is not an option, should he ask, once you confirm the change in address) We only have a [place] address on file unfortunately, Bobby Delacruz is listed as a contact so I recommend getting an updated / current address from her)

## 2023-01-23 NOTE — Telephone Encounter (Signed)
[id number] 

## 2023-01-25 NOTE — Telephone Encounter (Signed)
Mailed letter to address we have on file unable to reach patient or patients spouse on file refills canceled thru pharmacy and they are aware patient has not been seen since 2022

## 2023-03-04 ENCOUNTER — Other Ambulatory Visit: Payer: Self-pay | Admitting: Family Medicine

## 2023-03-04 DIAGNOSIS — I10 Essential (primary) hypertension: Secondary | ICD-10-CM

## 2023-06-10 ENCOUNTER — Other Ambulatory Visit: Payer: Self-pay | Admitting: Family Medicine

## 2023-06-10 DIAGNOSIS — I10 Essential (primary) hypertension: Secondary | ICD-10-CM
